# Patient Record
Sex: Male | Born: 1955 | Race: White | Hispanic: No | Marital: Married | Smoking: Former smoker
Health system: Southern US, Community
[De-identification: ages and names within clinical notes are randomized; demographics above are authoritative.]

## PROBLEM LIST (undated history)

## (undated) DIAGNOSIS — S335XXA Sprain of ligaments of lumbar spine, initial encounter: Secondary | ICD-10-CM

## (undated) DIAGNOSIS — N400 Enlarged prostate without lower urinary tract symptoms: Secondary | ICD-10-CM

## (undated) DIAGNOSIS — R351 Nocturia: Secondary | ICD-10-CM

## (undated) DIAGNOSIS — S339XXA Sprain of unspecified parts of lumbar spine and pelvis, initial encounter: Secondary | ICD-10-CM

## (undated) DIAGNOSIS — D291 Benign neoplasm of prostate: Secondary | ICD-10-CM

## (undated) DIAGNOSIS — J40 Bronchitis, not specified as acute or chronic: Secondary | ICD-10-CM

## (undated) DIAGNOSIS — M1990 Unspecified osteoarthritis, unspecified site: Secondary | ICD-10-CM

## (undated) HISTORY — DX: Bronchitis, not specified as acute or chronic: J40

## (undated) HISTORY — DX: Benign neoplasm of prostate: D29.1

## (undated) HISTORY — DX: Unspecified osteoarthritis, unspecified site: M19.90

## (undated) HISTORY — DX: Benign prostatic hyperplasia without lower urinary tract symptoms: N40.0

## (undated) HISTORY — DX: Nocturia: R35.1

## (undated) HISTORY — DX: Sprain of unspecified parts of lumbar spine and pelvis, initial encounter: S33.9XXA

## (undated) HISTORY — DX: Sprain of ligaments of lumbar spine, initial encounter: S33.5XXA

---

## 1988-01-28 HISTORY — PX: SHOULDER SURGERY: SHX246

## 2013-03-27 HISTORY — PX: COLONOSCOPY: SHX174

## 2013-06-23 ENCOUNTER — Ambulatory Visit: Payer: Self-pay | Admitting: Family Medicine

## 2014-08-09 ENCOUNTER — Encounter: Payer: Self-pay | Admitting: *Deleted

## 2014-08-10 ENCOUNTER — Ambulatory Visit: Payer: Self-pay | Admitting: Urology

## 2014-08-15 ENCOUNTER — Ambulatory Visit (INDEPENDENT_AMBULATORY_CARE_PROVIDER_SITE_OTHER): Payer: Commercial Managed Care - PPO | Admitting: Urology

## 2014-08-15 ENCOUNTER — Encounter: Payer: Self-pay | Admitting: Urology

## 2014-08-15 VITALS — BP 152/65 | HR 69 | Resp 18 | Ht 70.0 in | Wt 193.8 lb

## 2014-08-15 DIAGNOSIS — N401 Enlarged prostate with lower urinary tract symptoms: Secondary | ICD-10-CM

## 2014-08-15 DIAGNOSIS — N138 Other obstructive and reflux uropathy: Secondary | ICD-10-CM | POA: Insufficient documentation

## 2014-08-15 LAB — BLADDER SCAN AMB NON-IMAGING

## 2014-08-15 MED ORDER — DOXAZOSIN MESYLATE 1 MG PO TABS: 1.0000 mg | ORAL_TABLET | Freq: Every day | ORAL | Status: DC

## 2014-08-15 MED ORDER — FINASTERIDE 5 MG PO TABS: 5.0000 mg | ORAL_TABLET | Freq: Every day | ORAL | Status: DC

## 2014-08-15 NOTE — Progress Notes (Signed)
08/15/2014 10:32 AM   Blanch Media Dec 08, 1955 [id number]  Referring provider: No referring provider defined for this encounter.  Chief Complaint  Patient presents with  . Benign Prostatic Hypertrophy     month follow up  . Nocturia    HPI: Mr. Helzer is a 59 year old white male with BPH and LUTS who presents today for 6 month follow-up. His BPH is currently being managed with finasteride and Cardura 1 mg.  We had discussed in the past that his Cardura dose was sub therapeutic, but he did not want to increase the dosing.  Today his IPSS score is 8, which is moderate lower urinary tract symptomatology. He is mostly satisfied with his quality of life due to his urinary symptoms. He denies any dysuria, gross hematuria or suprapubic pain. His PVR today is 42 mL. He also denies any recent fevers, chills, nausea or vomiting.      IPSS      08/15/14 0900       International Prostate Symptom Score   How often have you had the sensation of not emptying your bladder? Less than 1 in 5     How often have you had to urinate less than every two hours? Almost always     How often have you found you stopped and started again several times when you urinated? Not at All     How often have you found it difficult to postpone urination? Not at All     How often have you had a weak urinary stream? Less than 1 in 5 times     How often have you had to strain to start urination? Not at All     How many times did you typically get up at night to urinate? 1 Time     Total IPSS Score 8     Quality of Life due to urinary symptoms   If you were to spend the rest of your life with your urinary condition just the way it is now how would you feel about that? Mostly Satisfied        Score:  1-7 Mild 8-19 Moderate 20-35 Severe   PMH: Past Medical History  Diagnosis Date  . Arthritis   . Bronchitis   . Benign neoplasm of prostate   . Osteoarthritis     lumbar spine  . Lumbosacral ligament sprain     . Nocturia   . BPH (benign prostatic hyperplasia)     Surgical History: Past Surgical History  Procedure Laterality Date  . Shoulder surgery Right 1990    Home Medications:    Medication List       This list is accurate as of: 08/15/14 10:32 AM.  Always use your most recent med list.               doxazosin 1 MG tablet  Commonly known as:  CARDURA  Take 1 mg by mouth daily.     finasteride 5 MG tablet  Commonly known as:  PROSCAR  Take 5 mg by mouth daily.     ibuprofen 800 MG tablet  Commonly known as:  ADVIL,MOTRIN  Take 800 mg by mouth every 8 (eight) hours as needed.        Allergies: No Known Allergies  Family History: Family History  Problem Relation Age of Onset  . Kidney disease Neg Hx   . Prostate cancer Neg Hx     Social History:  reports that he has quit smoking. He  does not have any smokeless tobacco history on file. He reports that he drinks alcohol. His drug history is not on file.  ROS: UROLOGY Frequent Urination?: No Hard to postpone urination?: No Burning/pain with urination?: No Get up at night to urinate?: No Leakage of urine?: No Urine stream starts and stops?: No Trouble starting stream?: No Do you have to strain to urinate?: No Blood in urine?: No Urinary tract infection?: No Sexually transmitted disease?: No Injury to kidneys or bladder?: No Painful intercourse?: No Weak stream?: No Erection problems?: No Penile pain?: No  Gastrointestinal Nausea?: No Vomiting?: No Indigestion/heartburn?: No Diarrhea?: No Constipation?: No  Constitutional Fever: No Night sweats?: No Weight loss?: No Fatigue?: No  Skin Skin rash/lesions?: No Itching?: No  Eyes Blurred vision?: No Double vision?: No  Ears/Nose/Throat Sore throat?: No Sinus problems?: No  Hematologic/Lymphatic Swollen glands?: No Easy bruising?: No  Cardiovascular Leg swelling?: No Chest pain?: No  Respiratory Cough?: No Shortness of breath?:  No  Endocrine Excessive thirst?: No  Musculoskeletal Back pain?: No Joint pain?: No  Neurological Headaches?: No Dizziness?: No  Psychologic Depression?: No Anxiety?: No  Physical Exam: BP 152/65 mmHg  Pulse 69  Resp 18  Ht 5\' 10"  (1.778 m)  Wt 193 lb 12.8 oz (87.907 kg)  BMI 27.81 kg/m2  GU: Patient with circumcised phallus.  Urethral meatus is patent.  No penile discharge. No penile lesions or rashes. Scrotum without lesions, cysts, rashes and/or edema.  Testicles are located scrotally bilaterally. No masses are appreciated in the testicles. Left and right epididymis are normal. Rectal: Patient with  normal sphincter tone. Perineum without scarring or rashes. No rectal masses are appreciated. Prostate is approximately 50 grams, no nodules are appreciated. Seminal vesicles are normal.   Laboratory Data: Results for orders placed or performed in visit on 08/15/14  BLADDER SCAN AMB NON-IMAGING  Result Value Ref Range   Scan Result 11ml    No results found for: WBC, HGB, HCT, MCV, PLT  No results found for: CREATININE  No results found for: PSA  No results found for: TESTOSTERONE  No results found for: HGBA1C  Urinalysis No results found for: COLORURINE, APPEARANCEUR, LABSPEC, PHURINE, GLUCOSEU, HGBUR, BILIRUBINUR, KETONESUR, PROTEINUR, UROBILINOGEN, NITRITE, LEUKOCYTESUR  Pertinent Imaging:   Assessment & Plan:    1. BPH (benign prostatic hyperplasia) with LUTS:   Patient's BPH with LUTS is well controlled with finasteride and Cardura. I have sent refills for both these medications to his pharmacy. He will follow-up in 6 months time for repeat IPSS score, PVR, DRE and PSA.  PSA history:    0.3 ng/mL on 02/09/2014  - PSA - BLADDER SCAN AMB NON-IMAGING   No Follow-up on file.  Zara Council, [facility] [address] [place], [place] [address] [phone]

## 2014-08-16 LAB — PSA: PROSTATE SPECIFIC AG, SERUM: 0.4 ng/mL (ref 0.0–4.0)

## 2014-08-17 ENCOUNTER — Telehealth: Payer: Self-pay

## 2014-08-17 NOTE — Telephone Encounter (Signed)
-----   Message from Nori Riis, PA-C sent at 08/16/2014  6:12 PM EDT ----- Patient's PSA is stable. We will see him in one year.

## 2014-08-17 NOTE — Telephone Encounter (Signed)
Spoke with pt in reference to PSA. Pt voiced understanding.  

## 2014-09-08 ENCOUNTER — Encounter: Payer: Self-pay | Admitting: Family Medicine

## 2014-09-08 ENCOUNTER — Ambulatory Visit (INDEPENDENT_AMBULATORY_CARE_PROVIDER_SITE_OTHER): Payer: Commercial Managed Care - PPO | Admitting: Family Medicine

## 2014-09-08 VITALS — BP 130/98 | HR 70 | Ht 70.0 in | Wt 194.0 lb

## 2014-09-08 DIAGNOSIS — Z0000 Encounter for general adult medical examination without abnormal findings: Secondary | ICD-10-CM | POA: Diagnosis not present

## 2014-09-08 LAB — HEMOCCULT GUIAC POC 1CARD (OFFICE): FECAL OCCULT BLD: NEGATIVE

## 2014-09-08 LAB — POCT URINALYSIS DIPSTICK
Bilirubin, UA: NEGATIVE
Blood, UA: NEGATIVE
GLUCOSE UA: NEGATIVE
KETONES UA: NEGATIVE
Leukocytes, UA: NEGATIVE
Nitrite, UA: NEGATIVE
Protein, UA: NEGATIVE
SPEC GRAV UA: 1.02
UROBILINOGEN UA: 0.2
pH, UA: 6.5

## 2014-09-08 NOTE — Progress Notes (Signed)
Name: Jacob Allen   MRN: [id number]    DOB: Dec 29, 1955   Date:09/08/2014       Progress Note  Subjective  Chief Complaint  Chief Complaint  Patient presents with  . Annual Exam    HPI Comments: No subjective nor objective concerns noted.  Epigastric discomfort with Goodys.  Familial tremor discussed.   No problem-specific assessment & plan notes found for this encounter.   Past Medical History  Diagnosis Date  . Arthritis   . Bronchitis   . Benign neoplasm of prostate   . Osteoarthritis     lumbar spine  . Lumbosacral ligament sprain   . Nocturia   . BPH (benign prostatic hyperplasia)   . Benign prostate hyperplasia     Past Surgical History  Procedure Laterality Date  . Shoulder surgery Right 1990  . Colonoscopy  03/2013    cleared for 10 yrs- Dr Allen Norris    Family History  Problem Relation Age of Onset  . Kidney disease Neg Hx   . Prostate cancer Neg Hx     Social History   Social History  . Marital Status: Married    Spouse Name: N/A  . Number of Children: N/A  . Years of Education: N/A   Occupational History  . Not on file.   Social History Main Topics  . Smoking status: Former Research scientist (life sciences)  . Smokeless tobacco: Not on file     Comment: quit 2 years ago  . Alcohol Use: 0.0 oz/week    0 Standard drinks or equivalent per week     Comment: occasional  . Drug Use: Not on file  . Sexual Activity: Yes   Other Topics Concern  . Not on file   Social History Narrative    No Known Allergies   Review of Systems  Constitutional: Negative for fever, chills, weight loss and malaise/fatigue.  HENT: Negative for ear discharge, ear pain and sore throat.   Eyes: Negative for blurred vision.  Respiratory: Negative for cough, sputum production, shortness of breath and wheezing.   Cardiovascular: Negative for chest pain, palpitations and leg swelling.  Gastrointestinal: Positive for heartburn. Negative for nausea, abdominal pain, diarrhea, constipation, blood in  stool and melena.  Genitourinary: Negative for dysuria, urgency, frequency and hematuria.  Musculoskeletal: Negative for myalgias, back pain, joint pain and neck pain.  Skin: Negative for rash.  Neurological: Positive for tremors. Negative for dizziness, tingling, sensory change, focal weakness and headaches.  Endo/Heme/Allergies: Negative for environmental allergies and polydipsia. Does not bruise/bleed easily.  Psychiatric/Behavioral: Negative for depression and suicidal ideas. The patient is not nervous/anxious and does not have insomnia.      Objective  Filed Vitals:   09/08/14 0936  BP: 130/98  Pulse: 70  Height: 5\' 10"  (1.778 m)  Weight: 194 lb (87.998 kg)    Physical Exam  Constitutional: He is oriented to person, place, and time and well-developed, well-nourished, and in no distress. No distress.  HENT:  Head: Normocephalic.  Right Ear: External ear normal.  Left Ear: External ear normal.  Nose: Nose normal.  Mouth/Throat: Oropharynx is clear and moist.  Eyes: Conjunctivae and EOM are normal. Pupils are equal, round, and reactive to light. Right eye exhibits no discharge. Left eye exhibits no discharge. No scleral icterus.  Neck: Normal range of motion. Neck supple. No JVD present. No tracheal deviation present. No thyromegaly present.  Cardiovascular: Normal rate, regular rhythm, normal heart sounds and intact distal pulses.  Exam reveals no gallop and no  friction rub.   No murmur heard. Pulmonary/Chest: Breath sounds normal. No respiratory distress. He has no wheezes. He has no rales. He exhibits no tenderness.  Abdominal: Soft. Bowel sounds are normal. He exhibits no distension and no mass. There is no hepatosplenomegaly. There is no tenderness. There is no rebound, no guarding and no CVA tenderness.  Genitourinary: Rectum normal, prostate normal and penis normal. Guaiac negative stool. He exhibits no abnormal testicular mass and no testicular tenderness.   Musculoskeletal: Normal range of motion. He exhibits no edema or tenderness.  Lymphadenopathy:    He has no cervical adenopathy.  Neurological: He is alert and oriented to person, place, and time. He has normal sensation, normal strength, normal reflexes and intact cranial nerves. No cranial nerve deficit.  Skin: Skin is warm. No rash noted.  Psychiatric: Mood and affect normal.  Nursing note and vitals reviewed.     Assessment & Plan  Problem List Items Addressed This Visit      Other   Annual physical exam - Primary   Relevant Orders   Renal Function Panel   Lipid Profile   POCT Occult Blood Stool (Completed)   POCT Urinalysis Dipstick        Dr. Otilio Miu [facility]  09/08/2014

## 2014-09-09 LAB — RENAL FUNCTION PANEL
Albumin: 4.7 g/dL (ref 3.5–5.5)
BUN/Creatinine Ratio: 12 (ref 9–20)
BUN: 11 mg/dL (ref 6–24)
CO2: 23 mmol/L (ref 18–29)
Calcium: 9.3 mg/dL (ref 8.7–10.2)
Chloride: 95 mmol/L — ABNORMAL LOW (ref 97–108)
Creatinine, Ser: 0.89 mg/dL (ref 0.76–1.27)
GFR calc Af Amer: 110 mL/min/{1.73_m2}
GFR calc non Af Amer: 96 mL/min/{1.73_m2}
Glucose: 95 mg/dL (ref 65–99)
Phosphorus: 3.7 mg/dL (ref 2.5–4.5)
Potassium: 4.4 mmol/L (ref 3.5–5.2)
Sodium: 135 mmol/L (ref 134–144)

## 2014-09-09 LAB — LIPID PANEL
Chol/HDL Ratio: 2.5 ratio units (ref 0.0–5.0)
Cholesterol, Total: 175 mg/dL (ref 100–199)
HDL: 70 mg/dL (ref >39)
LDL CALC: 97 mg/dL (ref 0–99)
TRIGLYCERIDES: 39 mg/dL (ref 0–149)
VLDL Cholesterol Cal: 8 mg/dL (ref 5–40)

## 2014-10-12 ENCOUNTER — Other Ambulatory Visit: Payer: Self-pay | Admitting: Family Medicine

## 2014-10-13 ENCOUNTER — Other Ambulatory Visit: Payer: Self-pay

## 2015-02-14 IMAGING — CR DG LUMBAR SPINE COMPLETE 4+V
1 series · 5 of 5 positions shown · non-contrast
Comparison: None.

CLINICAL DATA: Low back pain.

EXAM:
LUMBAR SPINE - COMPLETE 4+ VIEW

[Series 1: ap · 0.17mm/px · 5 of 5 slices shown]
[im 1/5]
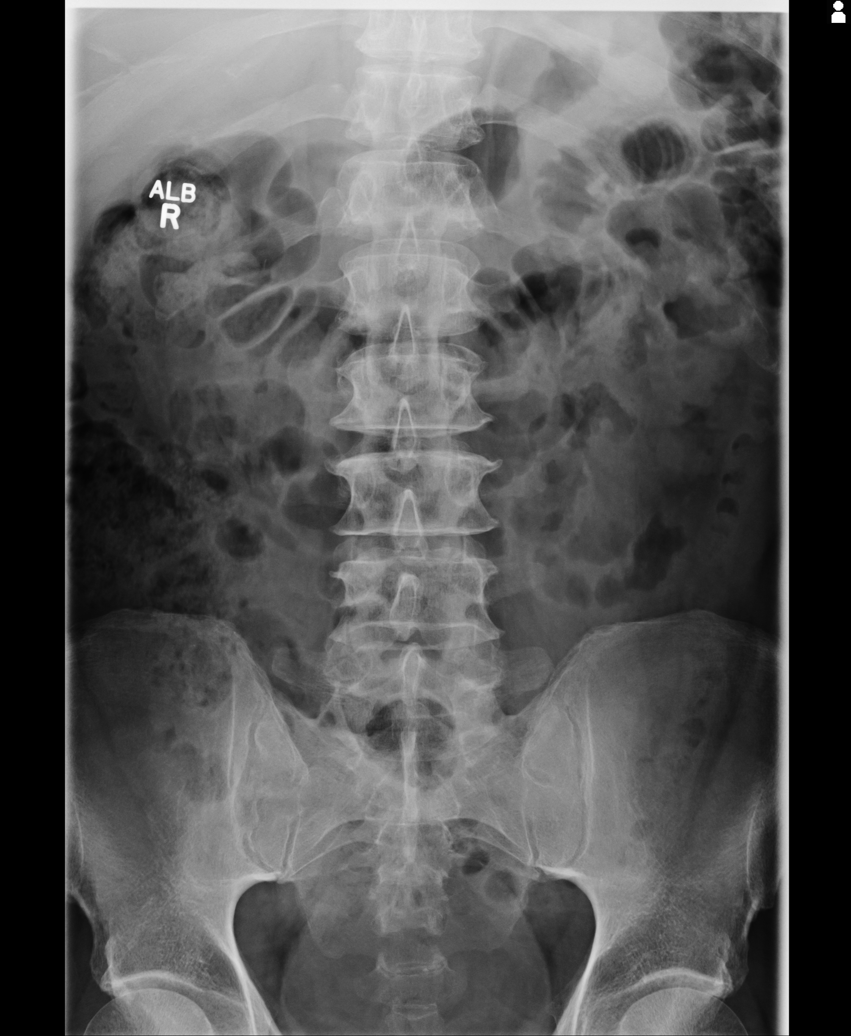
[im 2/5]
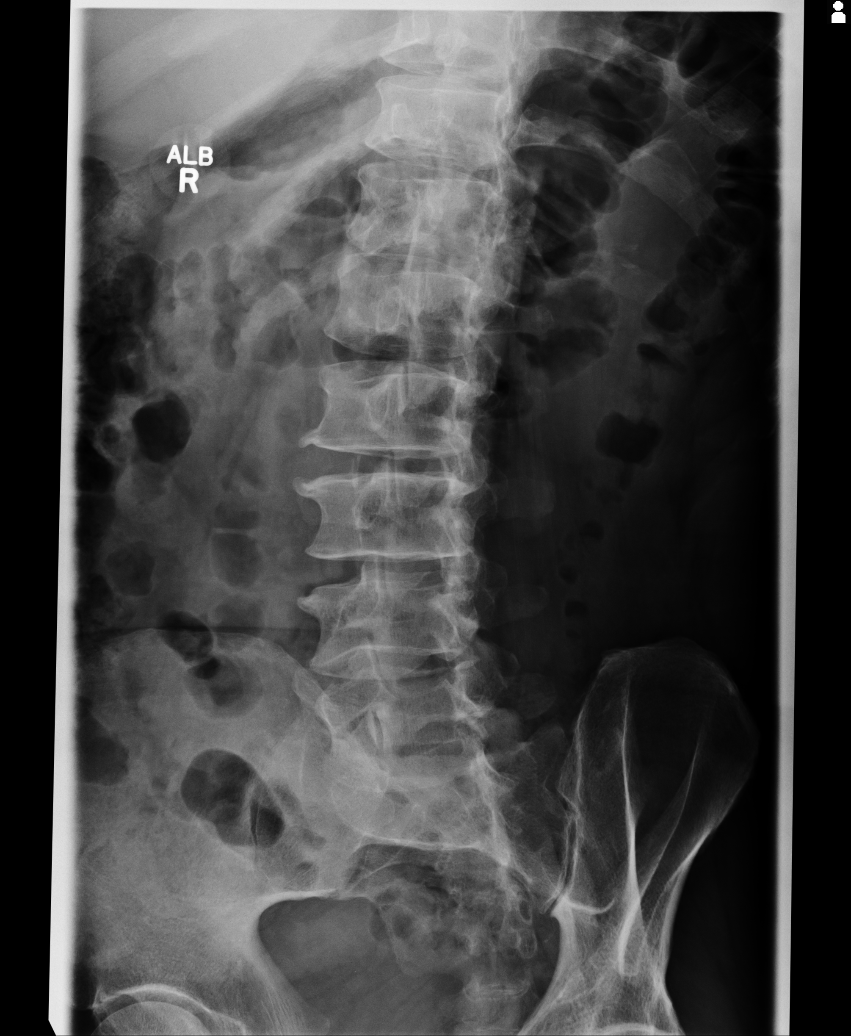
[im 3/5]
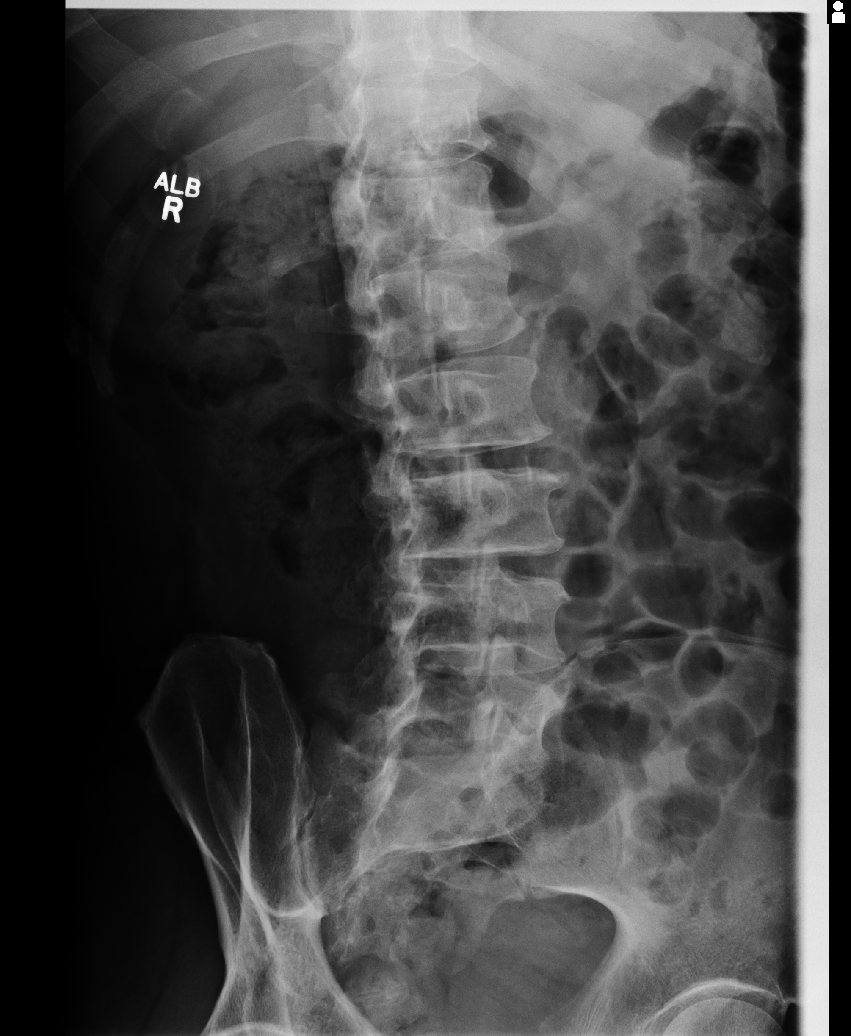
[im 4/5]
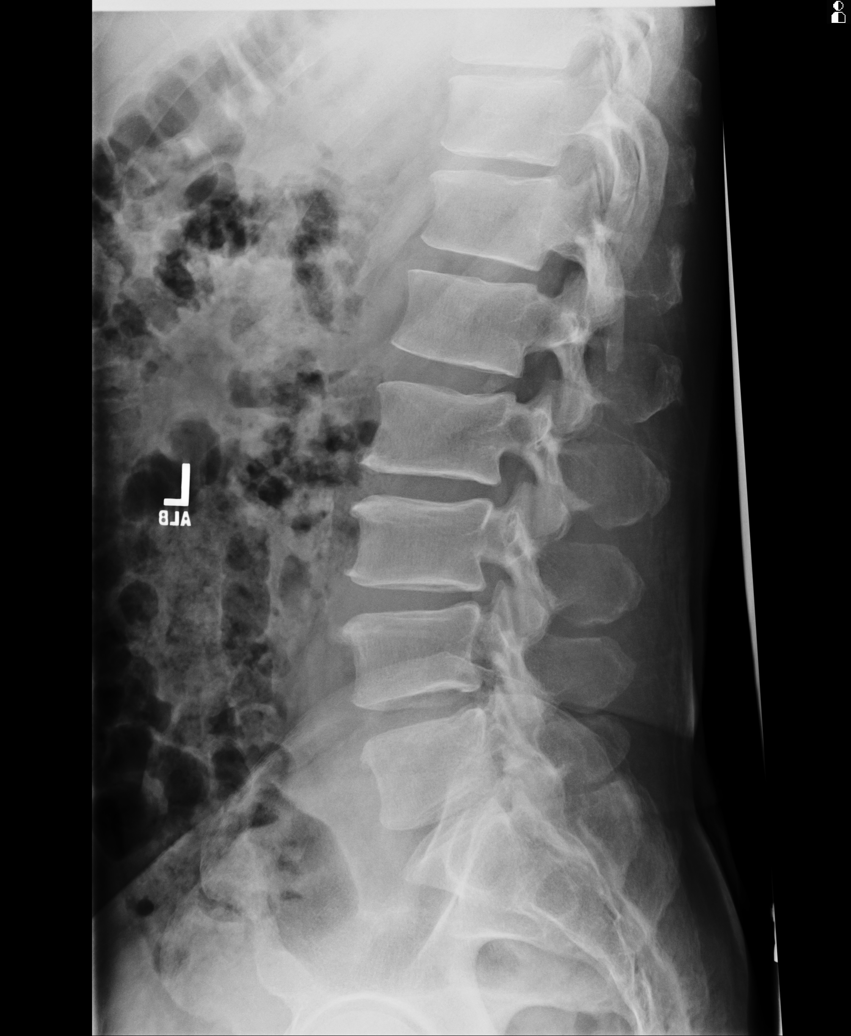
[im 5/5]
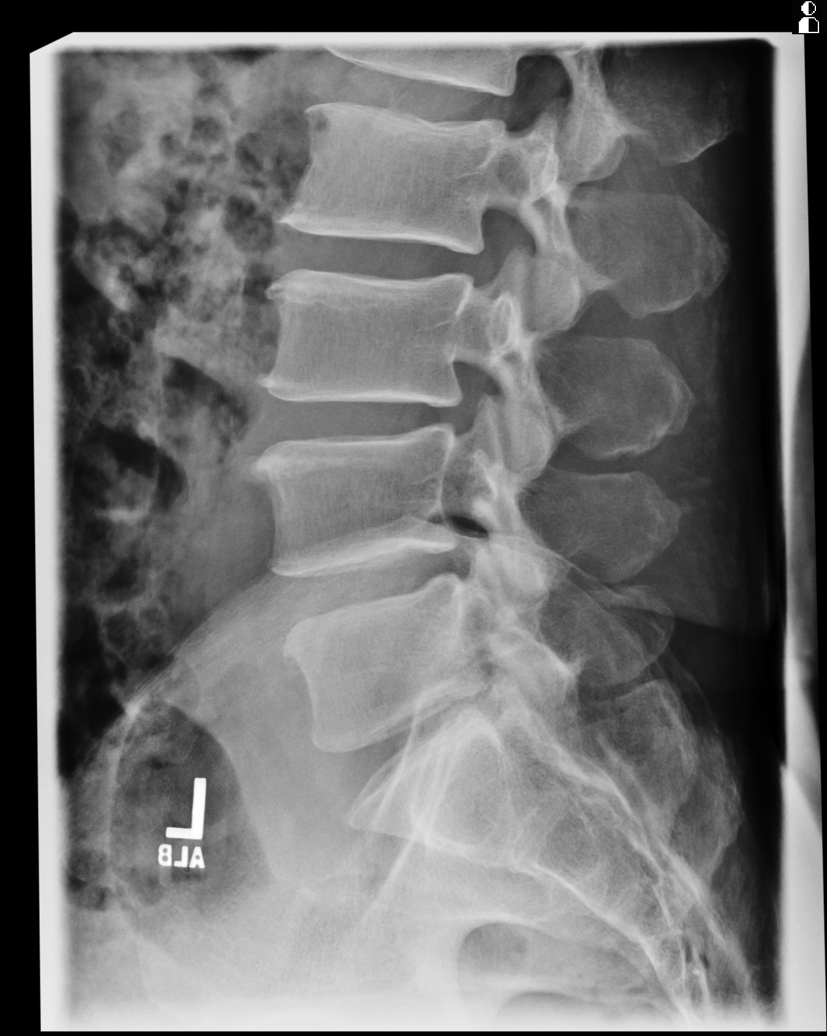

[5 of 5 positions shown; findings below may reference images not displayed]

FINDINGS: No fracture. No spondylolisthesis. Minor loss of disc height at
L2-L3 and L3-L4 with mild loss of disk height at L4-L5. Small
endplate osteophytes from L2 through L5. Remaining disk spaces and
the facet joints are well preserved.

The soft tissues are unremarkable.
IMPRESSION: No fracture or acute finding. Mild disk degenerative changes as
described.

## 2015-02-15 ENCOUNTER — Encounter: Payer: Self-pay | Admitting: Urology

## 2015-02-15 ENCOUNTER — Ambulatory Visit (INDEPENDENT_AMBULATORY_CARE_PROVIDER_SITE_OTHER): Payer: PRIVATE HEALTH INSURANCE | Admitting: Urology

## 2015-02-15 VITALS — BP 127/71 | HR 66 | Ht 70.0 in | Wt 183.3 lb

## 2015-02-15 DIAGNOSIS — N401 Enlarged prostate with lower urinary tract symptoms: Secondary | ICD-10-CM | POA: Diagnosis not present

## 2015-02-15 DIAGNOSIS — N138 Other obstructive and reflux uropathy: Secondary | ICD-10-CM

## 2015-02-15 LAB — BLADDER SCAN AMB NON-IMAGING: SCAN RESULT: 0

## 2015-02-15 NOTE — Progress Notes (Signed)
12:44 PM   Jacob Allen 1955/05/28 [id number]  Referring provider: No referring provider defined for this encounter.  Chief Complaint  Patient presents with  . Benign Prostatic Hypertrophy    6 month follow up    HPI: Patient is a 60 year old Caucasian male with BPH and LUT S who presents today for 6 month follow-up.  BPH WITH LUTS His IPSS score today is 3, which is mild lower urinary tract symptomatology. He is pleased with his quality life due to his urinary symptoms.  His PVR is 0 mL.  He denies any dysuria, hematuria or suprapubic pain.   He currently taking doxazosin 1 mg daily and finasteride 5 mg daily.  We had discussed in the past that his Cardura dose was sub therapeutic, but he did not want to increase the dosing.  He also denies any recent fevers, chills, nausea or vomiting.  He does not have a family history of PCa.      IPSS      02/15/15 0900       International Prostate Symptom Score   How often have you had the sensation of not emptying your bladder? Not at All     How often have you had to urinate less than every two hours? Not at All     How often have you found you stopped and started again several times when you urinated? Less than 1 in 5 times     How often have you found it difficult to postpone urination? Not at All     How often have you had a weak urinary stream? Less than 1 in 5 times     How often have you had to strain to start urination? Not at All     How many times did you typically get up at night to urinate? 1 Time     Total IPSS Score 3     Quality of Life due to urinary symptoms   If you were to spend the rest of your life with your urinary condition just the way it is now how would you feel about that? Pleased        Score:  1-7 Mild 8-19 Moderate 20-35 Severe   PMH: Past Medical History  Diagnosis Date  . Arthritis   . Bronchitis   . Benign neoplasm of prostate   . Osteoarthritis     lumbar spine  . Lumbosacral ligament  sprain   . Nocturia   . BPH (benign prostatic hyperplasia)   . Benign prostate hyperplasia     Surgical History: Past Surgical History  Procedure Laterality Date  . Shoulder surgery Right 1990  . Colonoscopy  03/2013    cleared for 10 yrs- Dr Allen Norris    Home Medications:    Medication List       This list is accurate as of: 02/15/15 12:44 PM.  Always use your most recent med list.               doxazosin 1 MG tablet  Commonly known as:  CARDURA  Take 1 tablet (1 mg total) by mouth daily.     finasteride 5 MG tablet  Commonly known as:  PROSCAR  Take 1 tablet (5 mg total) by mouth daily.     ibuprofen 800 MG tablet  Commonly known as:  ADVIL,MOTRIN  Take 800 mg by mouth every 8 (eight) hours as needed.        Allergies: No Known  Allergies  Family History: Family History  Problem Relation Age of Onset  . Kidney disease Neg Hx   . Prostate cancer Neg Hx     Social History:  reports that he has quit smoking. He does not have any smokeless tobacco history on file. He reports that he drinks alcohol. He reports that he does not use illicit drugs.  ROS: UROLOGY Frequent Urination?: No Hard to postpone urination?: No Burning/pain with urination?: No Get up at night to urinate?: No Leakage of urine?: No Urine stream starts and stops?: No Trouble starting stream?: No Do you have to strain to urinate?: No Blood in urine?: No Urinary tract infection?: No Sexually transmitted disease?: No Injury to kidneys or bladder?: No Painful intercourse?: No Weak stream?: No Erection problems?: No Penile pain?: No  Gastrointestinal Nausea?: No Vomiting?: No Indigestion/heartburn?: No Diarrhea?: No Constipation?: No  Constitutional Fever: No Night sweats?: No Weight loss?: No Fatigue?: No  Skin Skin rash/lesions?: No Itching?: No  Eyes Blurred vision?: No Double vision?: No  Ears/Nose/Throat Sore throat?: No Sinus problems?:  No  Hematologic/Lymphatic Swollen glands?: No Easy bruising?: No  Cardiovascular Leg swelling?: No Chest pain?: No  Respiratory Cough?: No Shortness of breath?: No  Endocrine Excessive thirst?: No  Musculoskeletal Back pain?: No Joint pain?: No  Neurological Headaches?: No Dizziness?: No  Psychologic Depression?: No Anxiety?: No  Physical Exam: BP 127/71 mmHg  Pulse 66  Ht 5\' 10"  (1.778 m)  Wt 183 lb 4.8 oz (83.144 kg)  BMI 26.30 kg/m2  GU: Patient with circumcised phallus.  Urethral meatus is patent.  No penile discharge. No penile lesions or rashes. Scrotum without lesions, cysts, rashes and/or edema.  Testicles are located scrotally bilaterally. No masses are appreciated in the testicles. Left and right epididymis are normal. Rectal: Patient with  normal sphincter tone. Perineum without scarring or rashes. No rectal masses are appreciated. Prostate is approximately 50 grams, no nodules are appreciated. Seminal vesicles are normal.   Laboratory Data:  Lab Results  Component Value Date   CREATININE 0.89 09/08/2014    PSA History   0.3 ng/mL on 02/09/2014   Pertinent Imaging: Results for Jacob, Allen (MRN [id number]) as of 02/15/2015 12:43  Ref. Range 02/15/2015 09:00  Scan Result Unknown 0    Assessment & Plan:    1. BPH (benign prostatic hyperplasia) with LUTS:   Patient's IPSS score is 3/1.  Patient's BPH with LUTS is well controlled with finasteride and Cardura.   He does not need a refill at this time.   He will follow-up in 12 months time for repeat IPSS score, exam and PSA.  - PSA - BLADDER SCAN AMB NON-IMAGING   Return in about 1 year (around 02/15/2016) for IPSS score and exam.  Zara Council, [facility]  [facility] [address], [place], [place] [address] [phone]

## 2015-02-16 ENCOUNTER — Telehealth: Payer: Self-pay

## 2015-02-16 LAB — PSA: Prostate Specific Ag, Serum: 0.3 ng/mL (ref 0.0–4.0)

## 2015-02-16 NOTE — Telephone Encounter (Signed)
Spoke with pt in reference to PSA results. Pt voiced understanding.  

## 2015-02-16 NOTE — Telephone Encounter (Signed)
-----   Message from Nori Riis, PA-C sent at 02/16/2015  7:50 AM EST ----- PSA is stable.  We will see him next year.

## 2015-07-13 ENCOUNTER — Encounter: Payer: Self-pay | Admitting: Family Medicine

## 2015-07-13 ENCOUNTER — Ambulatory Visit (INDEPENDENT_AMBULATORY_CARE_PROVIDER_SITE_OTHER): Payer: PRIVATE HEALTH INSURANCE | Admitting: Family Medicine

## 2015-07-13 VITALS — BP 134/70 | HR 64 | Ht 70.0 in | Wt 183.0 lb

## 2015-07-13 DIAGNOSIS — M542 Cervicalgia: Secondary | ICD-10-CM | POA: Insufficient documentation

## 2015-07-13 DIAGNOSIS — M5412 Radiculopathy, cervical region: Secondary | ICD-10-CM

## 2015-07-13 MED ORDER — PREDNISONE 10 MG PO TABS: 10.0000 mg | ORAL_TABLET | Freq: Every day | ORAL | Status: DC

## 2015-07-13 MED ORDER — MELOXICAM 15 MG PO TABS
15.0000 mg | ORAL_TABLET | Freq: Every day | ORAL | Status: DC
Start: 2015-07-13 — End: 2016-04-18

## 2015-07-13 NOTE — Progress Notes (Signed)
Name: Jacob Allen   MRN: [id number]    DOB: 1955-09-24   Date:07/13/2015       Progress Note  Subjective  Chief Complaint  Chief Complaint  Patient presents with  . Neck Pain    runs down shoulder and back of arm- has been to chiropractor x 10 visits- not getting better    Neck Pain  This is a chronic problem. The current episode started more than 1 year ago. The problem occurs daily. The problem has been gradually worsening. The pain is associated with nothing. The pain is present in the right side. The quality of the pain is described as aching. The pain is at a severity of 1/10. The pain is mild. Exacerbated by: positioning at computer. The pain is same all the time. Associated symptoms include numbness, paresis, tingling and weakness. Pertinent negatives include no chest pain, fever, headaches or weight loss. He has tried NSAIDs and chiropractic manipulation for the symptoms. The treatment provided mild relief.    No problem-specific assessment & plan notes found for this encounter.   Past Medical History  Diagnosis Date  . Arthritis   . Bronchitis   . Benign neoplasm of prostate   . Osteoarthritis     lumbar spine  . Lumbosacral ligament sprain   . Nocturia   . BPH (benign prostatic hyperplasia)   . Benign prostate hyperplasia     Past Surgical History  Procedure Laterality Date  . Shoulder surgery Right 1990  . Colonoscopy  03/2013    cleared for 10 yrs- Dr Allen Norris    Family History  Problem Relation Age of Onset  . Kidney disease Neg Hx   . Prostate cancer Neg Hx     Social History   Social History  . Marital Status: Married    Spouse Name: N/A  . Number of Children: N/A  . Years of Education: N/A   Occupational History  . Not on file.   Social History Main Topics  . Smoking status: Former Research scientist (life sciences)  . Smokeless tobacco: Not on file     Comment: quit 2 years ago  . Alcohol Use: 0.0 oz/week    0 Standard drinks or equivalent per week     Comment: every  day  . Drug Use: No  . Sexual Activity: Yes   Other Topics Concern  . Not on file   Social History Narrative    No Known Allergies   Review of Systems  Constitutional: Negative for fever, chills, weight loss and malaise/fatigue.  HENT: Negative for ear discharge, ear pain and sore throat.   Eyes: Negative for blurred vision.  Respiratory: Negative for cough, sputum production, shortness of breath and wheezing.   Cardiovascular: Negative for chest pain, palpitations and leg swelling.  Gastrointestinal: Negative for heartburn, nausea, abdominal pain, diarrhea, constipation, blood in stool and melena.  Genitourinary: Negative for dysuria, urgency, frequency and hematuria.  Musculoskeletal: Positive for neck pain. Negative for myalgias, back pain and joint pain.  Skin: Negative for rash.  Neurological: Positive for tingling, weakness and numbness. Negative for dizziness, sensory change, focal weakness and headaches.  Endo/Heme/Allergies: Negative for environmental allergies and polydipsia. Does not bruise/bleed easily.  Psychiatric/Behavioral: Negative for depression and suicidal ideas. The patient is not nervous/anxious and does not have insomnia.      Objective  Filed Vitals:   07/13/15 1353  BP: 134/70  Pulse: 64  Height: 5\' 10"  (1.778 m)  Weight: 183 lb (83.008 kg)    Physical Exam  Constitutional: He is oriented to person, place, and time and well-developed, well-nourished, and in no distress.  HENT:  Head: Normocephalic.  Right Ear: External ear normal.  Left Ear: External ear normal.  Nose: Nose normal.  Mouth/Throat: Oropharynx is clear and moist.  Eyes: Conjunctivae and EOM are normal. Pupils are equal, round, and reactive to light. Right eye exhibits no discharge. Left eye exhibits no discharge. No scleral icterus.  Neck: Normal range of motion. Neck supple. No JVD present. No tracheal deviation present. No thyromegaly present.  Cardiovascular: Normal rate,  regular rhythm, normal heart sounds, intact distal pulses and normal pulses.  Exam reveals no gallop and no friction rub.   No murmur heard. Pulmonary/Chest: Breath sounds normal. No respiratory distress. He has no wheezes. He has no rales.  Abdominal: Soft. Bowel sounds are normal. He exhibits no mass. There is no hepatosplenomegaly. There is no tenderness. There is no rebound, no guarding and no CVA tenderness.  Musculoskeletal: He exhibits no edema or tenderness.       Cervical back: He exhibits decreased range of motion and spasm. He exhibits no tenderness, no bony tenderness and normal pulse.  Lymphadenopathy:    He has no cervical adenopathy.  Neurological: He is alert and oriented to person, place, and time. He has normal sensation, normal strength and normal reflexes. No cranial nerve deficit.  Skin: Skin is warm. No rash noted.  Psychiatric: Mood and affect normal.  Nursing note and vitals reviewed.     Assessment & Plan  Problem List Items Addressed This Visit      Nervous and Auditory   Cervical radiculitis   Relevant Medications   meloxicam (MOBIC) 15 MG tablet   predniSONE (DELTASONE) 10 MG tablet     Other   Cervical pain (neck) - Primary   Relevant Medications   meloxicam (MOBIC) 15 MG tablet   predniSONE (DELTASONE) 10 MG tablet        Dr. Chayse Zatarain [facility]  07/13/2015

## 2015-07-13 NOTE — Patient Instructions (Signed)

## 2015-07-16 ENCOUNTER — Other Ambulatory Visit: Payer: Self-pay

## 2015-08-07 ENCOUNTER — Other Ambulatory Visit: Payer: Self-pay

## 2015-08-07 DIAGNOSIS — M542 Cervicalgia: Secondary | ICD-10-CM

## 2015-08-29 ENCOUNTER — Ambulatory Visit: Payer: No Typology Code available for payment source | Attending: Orthopedic Surgery | Admitting: Physical Therapy

## 2015-08-29 DIAGNOSIS — M2560 Stiffness of unspecified joint, not elsewhere classified: Secondary | ICD-10-CM

## 2015-08-29 DIAGNOSIS — M6281 Muscle weakness (generalized): Secondary | ICD-10-CM

## 2015-08-29 DIAGNOSIS — M542 Cervicalgia: Secondary | ICD-10-CM | POA: Diagnosis not present

## 2015-08-30 ENCOUNTER — Encounter: Payer: Self-pay | Admitting: Physical Therapy

## 2015-08-30 NOTE — Therapy (Addendum)
[facility] [address] [place], [place], [address] Phone: [phone]   Fax:  [phone]  Physical Therapy Evaluation  Patient Details  Name: Jacob Allen MRN: [id number] Date of Birth: March 29, 1955 Referring Provider: Reche Dixon, PA-C  Encounter Date: 08/29/2015      PT End of Session - 08/30/15 2008    Visit Number 1   Number of Visits 1   Date for PT Re-Evaluation 08/30/15   PT Start Time [id number]   PT Stop Time 1501   PT Time Calculation (min) 67 min   Activity Tolerance Patient tolerated treatment well;Patient limited by pain   Behavior During Therapy [facility] for tasks assessed/performed      Past Medical History:  Diagnosis Date  . Arthritis   . Benign neoplasm of prostate   . Benign prostate hyperplasia   . BPH (benign prostatic hyperplasia)   . Bronchitis   . Lumbosacral ligament sprain   . Nocturia   . Osteoarthritis    lumbar spine    Past Surgical History:  Procedure Laterality Date  . COLONOSCOPY  03/2013   cleared for 10 yrs- Dr Allen Norris  . SHOULDER SURGERY Right 1990    There were no vitals filed for this visit.       Subjective Assessment - 08/30/15 2006    Limitations House hold activities;Other (comment)   Patient Stated Goals Decrease neck pain/ return to gym program with no pain.    Currently in Pain? Yes            [facility] PT Assessment - 08/30/15 0001      Assessment   Medical Diagnosis Cervical radiculopathy   Referring Provider Reche Dixon, PA-C       Pt. reports minimal neck discomfort at rest and increase R cervical/UT discomfort with repeated movement patterns/ overhead reaching.  Pt. reports R UE muscle weakness and grip strength deficits.      There.ex.: see HEP.  Discussed gym based ex. Program.      Pt. is a 60 y/o male with >2 years c/o neck pain/ R UE numbness/ muscle weakness.  Pt. has tried 8+ treatment sessions at [facility] with minimal to no benefits.  Pt. enjoys lifting wts/  working out but has been limited by chronic neck/ R UE radicular symptoms.  Cervical AROM WFL (all planes) and B shoulder AROM WNL.  Pt. has c/o numbness in R UE/grasp reported and will occaionally drop objects.  NDI: 16% self-perceived mild disability.  No c/o headaches at this time.  (+) R UT/cervical muscle tightness and tenderness noted with palpation as compared to L side.  B UE strength grossly 5/5 MMT except R shoulder flexion/ abd. 4+/5 MMT.  Grip strength: L 116#/ R 76#.  PT explained cervical/ UE anatomy on X-ray and cause of right sided C6-7 radicular symptoms.  Pt. instructed in proper technique/ posture with UE gym based and HEP.  Pt. instructed to contact PT over next several weeks if any questions of issues with return to ex. program.            Plan - 08/30/15 2009    Rehab Potential Good   PT Frequency 1x / week   PT Treatment/Interventions Dry needling;Manual techniques;Therapeutic activities;Therapeutic exercise;Electrical Stimulation;Cryotherapy;Moist Heat   PT Next Visit Plan 1x treatment with HEP focus       Patient will benefit from skilled therapeutic intervention in order to improve the following deficits and impairments:  Hypomobility, Pain, Improper body mechanics,  Impaired flexibility, Decreased strength  Visit Diagnosis: Neck pain  Muscle weakness (generalized)  Joint stiffness     Problem List Patient Active Problem List   Diagnosis Date Noted  . Cervical radiculitis 07/13/2015  . Cervical pain (neck) 07/13/2015  . Annual physical exam 09/08/2014  . BPH with obstruction/lower urinary tract symptoms 08/15/2014   Pura Spice, PT, DPT # [phone]  08/30/2015, 8:11 PM  [place] [facility] [address] [place], [place], [address] Phone: [phone]   Fax:  [phone]  Name: Jacob Allen MRN: [id number] Date of Birth: 1956-01-10

## 2015-09-10 ENCOUNTER — Ambulatory Visit (INDEPENDENT_AMBULATORY_CARE_PROVIDER_SITE_OTHER): Payer: PRIVATE HEALTH INSURANCE | Admitting: Family Medicine

## 2015-09-10 ENCOUNTER — Encounter: Payer: Self-pay | Admitting: Family Medicine

## 2015-09-10 VITALS — BP 130/90 | HR 72 | Ht 70.0 in | Wt 183.0 lb

## 2015-09-10 DIAGNOSIS — N138 Other obstructive and reflux uropathy: Secondary | ICD-10-CM

## 2015-09-10 DIAGNOSIS — Z0000 Encounter for general adult medical examination without abnormal findings: Secondary | ICD-10-CM

## 2015-09-10 DIAGNOSIS — N5201 Erectile dysfunction due to arterial insufficiency: Secondary | ICD-10-CM

## 2015-09-10 DIAGNOSIS — N401 Enlarged prostate with lower urinary tract symptoms: Secondary | ICD-10-CM | POA: Diagnosis not present

## 2015-09-10 LAB — POCT URINALYSIS DIPSTICK
BILIRUBIN UA: NEGATIVE
GLUCOSE UA: NEGATIVE
KETONES UA: NEGATIVE
Leukocytes, UA: NEGATIVE
Nitrite, UA: NEGATIVE
Protein, UA: NEGATIVE
RBC UA: NEGATIVE
SPEC GRAV UA: 1.01
UROBILINOGEN UA: 0.2
pH, UA: 7

## 2015-09-10 NOTE — Progress Notes (Signed)
Name: Jacob Allen   MRN: [id number]    DOB: [birth year]   Date:09/10/2015       Progress Note  Subjective  Chief Complaint  Chief Complaint  Patient presents with  . Annual Exam    Jacob Allen does his PSA levels    Patient presents for annual physical exam.    No problem-specific Assessment & Plan notes found for this encounter.   Past Medical History:  Diagnosis Date  . Arthritis   . Benign neoplasm of prostate   . Benign prostate hyperplasia   . BPH (benign prostatic hyperplasia)   . Bronchitis   . Lumbosacral ligament sprain   . Nocturia   . Osteoarthritis    lumbar spine    Past Surgical History:  Procedure Laterality Date  . COLONOSCOPY  03/2013   cleared for 10 yrs- Dr Allen Norris  . SHOULDER SURGERY Right 1990    Family History  Problem Relation Age of Onset  . Kidney disease Neg Hx   . Prostate cancer Neg Hx     Social History   Social History  . Marital status: Married    Spouse name: N/A  . Number of children: N/A  . Years of education: N/A   Occupational History  . Not on file.   Social History Main Topics  . Smoking status: Former Research scientist (life sciences)  . Smokeless tobacco: Not on file     Comment: quit 2 years ago  . Alcohol use 0.0 oz/week     Comment: every day  . Drug use: No  . Sexual activity: Yes   Other Topics Concern  . Not on file   Social History Narrative  . No narrative on file    No Known Allergies   Review of Systems  Constitutional: Negative for chills, fever, malaise/fatigue and weight loss.  HENT: Negative for ear discharge, ear pain and sore throat.   Eyes: Negative for blurred vision.  Respiratory: Negative for cough, sputum production, shortness of breath and wheezing.   Cardiovascular: Negative for chest pain, palpitations and leg swelling.  Gastrointestinal: Negative for abdominal pain, blood in stool, constipation, diarrhea, heartburn, melena and nausea.  Genitourinary: Negative for dysuria, frequency, hematuria and urgency.   Musculoskeletal: Positive for joint pain. Negative for back pain, myalgias and neck pain.  Skin: Negative for rash.  Neurological: Negative for dizziness, tingling, sensory change, focal weakness and headaches.  Endo/Heme/Allergies: Negative for environmental allergies and polydipsia. Does not bruise/bleed easily.  Psychiatric/Behavioral: Negative for depression and suicidal ideas. The patient is not nervous/anxious and does not have insomnia.      Objective  Vitals:   09/10/15 0831  BP: 130/90  Pulse: 72  Weight: 183 lb (83 kg)  Height: 5\' 10"  (1.778 m)    Physical Exam  Constitutional: He is oriented to person, place, and time and well-developed, well-nourished, and in no distress.  HENT:  Head: Normocephalic.  Right Ear: Tympanic membrane, external ear and ear canal normal.  Left Ear: Tympanic membrane, external ear and ear canal normal.  Nose: Nose normal.  Mouth/Throat: Oropharynx is clear and moist.  Eyes: Conjunctivae and EOM are normal. Pupils are equal, round, and reactive to light. Right eye exhibits no discharge. Left eye exhibits no discharge. No scleral icterus.  Neck: Normal range of motion. Neck supple. No JVD present. No tracheal deviation present. No thyromegaly present.  Cardiovascular: Normal rate, regular rhythm, normal heart sounds and intact distal pulses.  Exam reveals no gallop and no friction rub.   No  murmur heard. Pulmonary/Chest: Breath sounds normal. No respiratory distress. He has no wheezes. He has no rales.  Abdominal: Soft. Bowel sounds are normal. He exhibits no mass. There is no hepatosplenomegaly. There is no tenderness. There is no rebound, no guarding and no CVA tenderness.  Genitourinary: Rectum normal. Rectal exam shows guaiac negative stool. No discharge found.  Musculoskeletal: Normal range of motion. He exhibits no edema or tenderness.  Lymphadenopathy:    He has no cervical adenopathy.  Neurological: He is alert and oriented to person,  place, and time. He has normal sensation, normal strength, normal reflexes and intact cranial nerves. No cranial nerve deficit.  Skin: Skin is warm. No rash noted.  Psychiatric: Mood and affect normal.  Nursing note and vitals reviewed.     Assessment & Plan  Problem List Items Addressed This Visit      Genitourinary   BPH with obstruction/lower urinary tract symptoms   Relevant Orders   POCT urinalysis dipstick (Completed)     Other   Annual physical exam - Primary   Relevant Orders   COMPLETE METABOLIC PANEL WITH GFR   Lipid Profile    Other Visit Diagnoses    Erectile dysfunction due to arterial insufficiency       sample viagra   Relevant Orders   Testosterone, Free, Total, SHBG        Dr. Darcy Cordner [facility]  09/10/15

## 2015-09-11 LAB — LIPID PANEL
CHOLESTEROL TOTAL: 207 mg/dL — AB (ref 100–199)
Chol/HDL Ratio: 2.7 ratio units (ref 0.0–5.0)
HDL: 78 mg/dL (ref >39)
LDL Calculated: 118 mg/dL — ABNORMAL HIGH (ref 0–99)
Triglycerides: 53 mg/dL (ref 0–149)
VLDL Cholesterol Cal: 11 mg/dL (ref 5–40)

## 2015-09-11 LAB — TESTOSTERONE, FREE, TOTAL, SHBG
Sex Hormone Binding: 55.3 nmol/L (ref 19.3–76.4)
Testosterone, Free: 9.2 pg/mL (ref 7.2–24.0)
Testosterone: 398 ng/dL (ref 264–916)

## 2015-09-18 ENCOUNTER — Encounter: Payer: PRIVATE HEALTH INSURANCE | Admitting: Physical Therapy

## 2015-09-18 NOTE — Addendum Note (Signed)
Addended by: Dorcas Carrow C on: 09/18/2015 01:30 PM   Modules accepted: Orders

## 2015-10-10 ENCOUNTER — Other Ambulatory Visit: Payer: Self-pay | Admitting: Urology

## 2015-10-10 DIAGNOSIS — N400 Enlarged prostate without lower urinary tract symptoms: Secondary | ICD-10-CM

## 2015-10-24 ENCOUNTER — Telehealth: Payer: Self-pay | Admitting: Urology

## 2015-10-24 DIAGNOSIS — N400 Enlarged prostate without lower urinary tract symptoms: Secondary | ICD-10-CM

## 2015-10-24 MED ORDER — DOXAZOSIN MESYLATE 1 MG PO TABS: 1.0000 mg | ORAL_TABLET | Freq: Every day | ORAL | 2 refills | Status: DC

## 2015-10-24 MED ORDER — FINASTERIDE 5 MG PO TABS: 5.0000 mg | ORAL_TABLET | Freq: Every day | ORAL | 2 refills | Status: DC

## 2015-10-24 NOTE — Telephone Encounter (Signed)
Patient is requesting a prescription for a 90-day supply of his medications:  Doxazosin and Finasteride sent into the [facility] in [place].   He is going out of town for 2 months on Friday and will not have enough medication.  Please have someone call him and let him know.

## 2015-10-24 NOTE — Telephone Encounter (Signed)
Medications were sent to pharmacy.

## 2015-10-24 NOTE — Telephone Encounter (Signed)
That is fine 

## 2016-02-11 ENCOUNTER — Other Ambulatory Visit: Payer: PRIVATE HEALTH INSURANCE

## 2016-02-18 ENCOUNTER — Ambulatory Visit: Payer: PRIVATE HEALTH INSURANCE | Admitting: Urology

## 2016-03-25 ENCOUNTER — Other Ambulatory Visit: Payer: Self-pay

## 2016-03-25 DIAGNOSIS — N401 Enlarged prostate with lower urinary tract symptoms: Secondary | ICD-10-CM

## 2016-03-26 ENCOUNTER — Other Ambulatory Visit: Payer: PRIVATE HEALTH INSURANCE

## 2016-03-26 DIAGNOSIS — N401 Enlarged prostate with lower urinary tract symptoms: Secondary | ICD-10-CM

## 2016-03-27 LAB — PSA: PROSTATE SPECIFIC AG, SERUM: 0.5 ng/mL (ref 0.0–4.0)

## 2016-04-01 NOTE — Progress Notes (Signed)
8:38 AM   Jacob Allen 10-31-1955 [id number]  Referring provider: Juline Patch, MD [address] [place], [place] [address]  Chief Complaint  Patient presents with  . Benign Prostatic Hypertrophy    HPI: 61 yo WM with BPH with LU TS and ED who present for a 1 year follow up.  BPH WITH LUTS His IPSS score today is 4, which is mild lower urinary tract symptomatology.  He is mostly satisfied with his quality life due to his urinary symptoms.  His PVR is 0 mL.  His previous I PSS score was 3/2.  His previous PVR was 0 mL.  He is complaining of frequency and nocturia x 1.  He denies any dysuria, hematuria or suprapubic pain.   He currently taking doxazosin 1 mg daily and finasteride 5 mg daily.  We had discussed in the past that his Cardura dose was sub therapeutic, but he did not want to increase the dosing.  He also denies any recent fevers, chills, nausea or vomiting.  He does not have a family history of PCa.     IPSS    Row Name 04/02/16 0800         International Prostate Symptom Score   How often have you had the sensation of not emptying your bladder? Less than 1 in 5     How often have you had to urinate less than every two hours? Not at All     How often have you found you stopped and started again several times when you urinated? Not at All     How often have you found it difficult to postpone urination? Less than 1 in 5 times     How often have you had a weak urinary stream? Less than 1 in 5 times     How often have you had to strain to start urination? Not at All     How many times did you typically get up at night to urinate? 1 Time     Total IPSS Score 4       Quality of Life due to urinary symptoms   If you were to spend the rest of your life with your urinary condition just the way it is now how would you feel about that? Mostly Satisfied        Score:  1-7 Mild 8-19 Moderate 20-35 Severe  Erectile dysfunction His SHIM score is 14, which is  mild to moderate ED.   He has been having difficulty with erections for one year.   His major complaint is achieving and maintaining an erection.  His libido is preserved.   His risk factors for ED are age and BPH.   He denies any painful erections or curvatures with his erections.   He is still having spontaneous erections.   He has tried Viagra in the past and it was effective.       SHIM    Row Name 04/02/16 [id number]         SHIM: Over the last 6 months:   How do you rate your confidence that you could get and keep an erection? Low     When you had erections with sexual stimulation, how often were your erections hard enough for penetration (entering your partner)? Sometimes (about half the time)     During sexual intercourse, how often were you able to maintain your erection after you had penetrated (entered) your partner? Difficult  During sexual intercourse, how difficult was it to maintain your erection to completion of intercourse? Difficult     When you attempted sexual intercourse, how often was it satisfactory for you? Difficult       SHIM Total Score   SHIM 14        Score: 1-7 Severe ED 8-11 Moderate ED 12-16 Mild-Moderate ED 17-21 Mild ED 22-25 No ED   PMH: Past Medical History:  Diagnosis Date  . Arthritis   . Benign neoplasm of prostate   . Benign prostate hyperplasia   . BPH (benign prostatic hyperplasia)   . Bronchitis   . Lumbosacral ligament sprain   . Nocturia   . Osteoarthritis    lumbar spine    Surgical History: Past Surgical History:  Procedure Laterality Date  . COLONOSCOPY  03/2013   cleared for 10 yrs- Dr Allen Norris  . SHOULDER SURGERY Right 1990    Home Medications:  Allergies as of 04/02/2016   No Known Allergies     Medication List       Accurate as of 04/02/16  8:38 AM. Always use your most recent med list.          doxazosin 1 MG tablet Commonly known as:  CARDURA Take 1 tablet (1 mg total) by mouth daily.   finasteride 5 MG  tablet Commonly known as:  PROSCAR Take 1 tablet (5 mg total) by mouth daily.   gabapentin 100 MG capsule Commonly known as:  NEURONTIN Take 2 capsules by mouth at bedtime. Todd mundy   ibuprofen 800 MG tablet Commonly known as:  ADVIL,MOTRIN Take 800 mg by mouth every 8 (eight) hours as needed.   meloxicam 15 MG tablet Commonly known as:  MOBIC Take 1 tablet (15 mg total) by mouth daily.       Allergies: No Known Allergies  Family History: Family History  Problem Relation Age of Onset  . Kidney disease Neg Hx   . Prostate cancer Neg Hx     Social History:  reports that he has quit smoking. He does not have any smokeless tobacco history on file. He reports that he drinks alcohol. He reports that he does not use drugs.  ROS: UROLOGY Frequent Urination?: No Hard to postpone urination?: No Burning/pain with urination?: No Get up at night to urinate?: No Leakage of urine?: No Urine stream starts and stops?: No Trouble starting stream?: No Do you have to strain to urinate?: No Blood in urine?: No Urinary tract infection?: No Sexually transmitted disease?: No Injury to kidneys or bladder?: No Painful intercourse?: No Weak stream?: No Erection problems?: No Penile pain?: No  Gastrointestinal Nausea?: No Vomiting?: No Indigestion/heartburn?: No Diarrhea?: No Constipation?: No  Constitutional Fever: No Night sweats?: No Weight loss?: No Fatigue?: No  Skin Skin rash/lesions?: No Itching?: No  Eyes Blurred vision?: No Double vision?: No  Ears/Nose/Throat Sore throat?: No Sinus problems?: No  Hematologic/Lymphatic Swollen glands?: No Easy bruising?: No  Cardiovascular Leg swelling?: No Chest pain?: No  Respiratory Cough?: No Shortness of breath?: No  Endocrine Excessive thirst?: No  Musculoskeletal Back pain?: No Joint pain?: No  Neurological Headaches?: No Dizziness?: No  Psychologic Depression?: No Anxiety?: No  Physical  Exam: BP 130/76   Pulse 65   Ht 5\' 10"  (1.778 m)   Wt 191 lb 12.8 oz (87 kg)   BMI 27.52 kg/m   GU: Patient with circumcised phallus.  Urethral meatus is patent.  No penile discharge. No penile lesions or rashes. Scrotum  without lesions, cysts, rashes and/or edema.  Testicles are located scrotally bilaterally. No masses are appreciated in the testicles. Left and right epididymis are normal. Rectal: Patient with  normal sphincter tone. Perineum without scarring or rashes. No rectal masses are appreciated. Prostate is approximately 50 grams, no nodules are appreciated. Seminal vesicles are normal.   Laboratory Data:  Lab Results  Component Value Date   CREATININE 0.89 09/08/2014    PSA History   0.3 ng/mL on 02/09/2014   0.4 ng/mL on 08/15/2014   0.3 ng/mL on 02/15/2015   0.5 ng/mL on 03/26/2016   Assessment & Plan:    1. BPH with LUTS  - IPSS score is 4/2, it is stable worsening  - Continue conservative management, avoiding bladder irritants and timed voiding's  - Continue doxazosin 1 mg daily and finasteride 5 mg daily:refills given  - RTC in 12 months for IPSS, PSA and exam   2. Erectile dysfunction  - SHIM score is 14  - Continue sildenafil 20 mg, 3 to 5 tablets two hours prior to intercourse on an empty stomach, # 50  - RTC in 12 months for repeat SHIM score and exam   Return in about 1 year (around 04/02/2017) for IPSS, SHIM, PSA and exam.  Zara Council, [facility]  [place] [address], [place], [place] [address] [phone]

## 2016-04-02 ENCOUNTER — Encounter: Payer: Self-pay | Admitting: Urology

## 2016-04-02 ENCOUNTER — Ambulatory Visit (INDEPENDENT_AMBULATORY_CARE_PROVIDER_SITE_OTHER): Payer: PRIVATE HEALTH INSURANCE | Admitting: Urology

## 2016-04-02 VITALS — BP 130/76 | HR 65 | Ht 70.0 in | Wt 191.8 lb

## 2016-04-02 DIAGNOSIS — N400 Enlarged prostate without lower urinary tract symptoms: Secondary | ICD-10-CM

## 2016-04-02 DIAGNOSIS — N529 Male erectile dysfunction, unspecified: Secondary | ICD-10-CM

## 2016-04-02 DIAGNOSIS — N138 Other obstructive and reflux uropathy: Secondary | ICD-10-CM

## 2016-04-02 DIAGNOSIS — N401 Enlarged prostate with lower urinary tract symptoms: Secondary | ICD-10-CM

## 2016-04-02 MED ORDER — DOXAZOSIN MESYLATE 1 MG PO TABS: 1.0000 mg | ORAL_TABLET | Freq: Every day | ORAL | 3 refills | Status: DC

## 2016-04-02 MED ORDER — SILDENAFIL CITRATE 20 MG PO TABS: ORAL_TABLET | ORAL | 3 refills | Status: DC

## 2016-04-02 MED ORDER — FINASTERIDE 5 MG PO TABS: 5.0000 mg | ORAL_TABLET | Freq: Every day | ORAL | 3 refills | Status: DC

## 2016-04-18 ENCOUNTER — Encounter: Payer: Self-pay | Admitting: Family Medicine

## 2016-04-18 ENCOUNTER — Ambulatory Visit (INDEPENDENT_AMBULATORY_CARE_PROVIDER_SITE_OTHER): Payer: PRIVATE HEALTH INSURANCE | Admitting: Family Medicine

## 2016-04-18 VITALS — BP 128/84 | HR 60 | Temp 97.5°F | Ht 70.0 in | Wt 194.0 lb

## 2016-04-18 DIAGNOSIS — J0100 Acute maxillary sinusitis, unspecified: Secondary | ICD-10-CM | POA: Diagnosis not present

## 2016-04-18 MED ORDER — AMOXICILLIN 500 MG PO CAPS: 500.0000 mg | ORAL_CAPSULE | Freq: Three times a day (TID) | ORAL | 0 refills | Status: DC

## 2016-04-18 NOTE — Progress Notes (Signed)
Name: Jacob Allen   MRN: [id number]    DOB: [birth year]   Date:04/18/2016       Progress Note  Subjective  Chief Complaint  Chief Complaint  Patient presents with  . Cough    X2 days ago woke up in a sweat. Stuffy nose. Low-grade fever. Feels weak and shakey. White spots in back of throat. No production.     Cough  This is a new problem. The current episode started in the past 7 days. The problem has been gradually worsening. The problem occurs hourly. The cough is non-productive. Associated symptoms include chills, a fever, nasal congestion and postnasal drip. Pertinent negatives include no chest pain, ear pain, headaches, heartburn, myalgias, rash, sore throat, shortness of breath, weight loss or wheezing. Nothing aggravates the symptoms. He has tried OTC cough suppressant for the symptoms. The treatment provided no relief. There is no history of environmental allergies.    No problem-specific Assessment & Plan notes found for this encounter.   Past Medical History:  Diagnosis Date  . Arthritis   . Benign neoplasm of prostate   . Benign prostate hyperplasia   . BPH (benign prostatic hyperplasia)   . Bronchitis   . Lumbosacral ligament sprain   . Nocturia   . Osteoarthritis    lumbar spine    Past Surgical History:  Procedure Laterality Date  . COLONOSCOPY  03/2013   cleared for 10 yrs- Dr Allen Norris  . SHOULDER SURGERY Right 1990    Family History  Problem Relation Age of Onset  . Kidney disease Neg Hx   . Prostate cancer Neg Hx     Social History   Social History  . Marital status: Married    Spouse name: N/A  . Number of children: N/A  . Years of education: N/A   Occupational History  . Not on file.   Social History Main Topics  . Smoking status: Former Research scientist (life sciences)  . Smokeless tobacco: Never Used     Comment: quit 2 years ago  . Alcohol use 0.0 oz/week     Comment: every day  . Drug use: No  . Sexual activity: Yes   Other Topics Concern  . Not on file    Social History Narrative  . No narrative on file    No Known Allergies  Outpatient Medications Prior to Visit  Medication Sig Dispense Refill  . doxazosin (CARDURA) 1 MG tablet Take 1 tablet (1 mg total) by mouth daily. 90 tablet 3  . finasteride (PROSCAR) 5 MG tablet Take 1 tablet (5 mg total) by mouth daily. 90 tablet 3  . ibuprofen (ADVIL,MOTRIN) 800 MG tablet Take 800 mg by mouth every 8 (eight) hours as needed.    . gabapentin (NEURONTIN) 100 MG capsule Take 2 capsules by mouth at bedtime. Reche Dixon    . meloxicam (MOBIC) 15 MG tablet Take 1 tablet (15 mg total) by mouth daily. (Patient not taking: Reported on 04/02/2016) 30 tablet 0  . sildenafil (REVATIO) 20 MG tablet Take 3 to 5 tablets two hours before intercouse on an empty stomach.  Do not take with nitrates. (Patient not taking: Reported on 04/18/2016) 50 tablet 3   No facility-administered medications prior to visit.     Review of Systems  Constitutional: Positive for chills and fever. Negative for malaise/fatigue and weight loss.  HENT: Positive for postnasal drip. Negative for ear discharge, ear pain and sore throat.   Eyes: Negative for blurred vision.  Respiratory: Positive for cough. Negative  for sputum production, shortness of breath and wheezing.   Cardiovascular: Negative for chest pain, palpitations and leg swelling.  Gastrointestinal: Negative for abdominal pain, blood in stool, constipation, diarrhea, heartburn, melena and nausea.  Genitourinary: Negative for dysuria, frequency, hematuria and urgency.  Musculoskeletal: Negative for back pain, joint pain, myalgias and neck pain.  Skin: Negative for rash.  Neurological: Negative for dizziness, tingling, sensory change, focal weakness and headaches.  Endo/Heme/Allergies: Negative for environmental allergies and polydipsia. Does not bruise/bleed easily.  Psychiatric/Behavioral: Negative for depression and suicidal ideas. The patient is not nervous/anxious and does  not have insomnia.      Objective  Vitals:   04/18/16 1154  BP: 128/84  Pulse: 60  Temp: 97.5 F (36.4 C)  SpO2: 97%  Weight: 194 lb (88 kg)  Height: 5\' 10"  (1.778 m)    Physical Exam  Constitutional: He is oriented to person, place, and time and well-developed, well-nourished, and in no distress.  HENT:  Head: Normocephalic.  Right Ear: External ear normal.  Left Ear: External ear normal.  Nose: Nose normal.  Mouth/Throat: Oropharynx is clear and moist.  Eyes: Conjunctivae and EOM are normal. Pupils are equal, round, and reactive to light. Right eye exhibits no discharge. Left eye exhibits no discharge. No scleral icterus.  Neck: Normal range of motion. Neck supple. No JVD present. No tracheal deviation present. No thyromegaly present.  Cardiovascular: Normal rate, regular rhythm, normal heart sounds and intact distal pulses.  Exam reveals no gallop and no friction rub.   No murmur heard. Pulmonary/Chest: Breath sounds normal. No respiratory distress. He has no wheezes. He has no rales. He exhibits no tenderness.  Abdominal: Soft. Bowel sounds are normal. He exhibits no mass. There is no hepatosplenomegaly. There is no tenderness. There is no rebound, no guarding and no CVA tenderness.  Musculoskeletal: Normal range of motion. He exhibits no edema or tenderness.  Lymphadenopathy:    He has no cervical adenopathy.  Neurological: He is alert and oriented to person, place, and time. He has normal sensation, normal strength, normal reflexes and intact cranial nerves. No cranial nerve deficit.  Skin: Skin is warm. No rash noted.  Psychiatric: Mood and affect normal.  Nursing note and vitals reviewed.     Assessment & Plan  Problem List Items Addressed This Visit    None    Visit Diagnoses    Acute maxillary sinusitis, recurrence not specified    -  Primary   Relevant Medications   amoxicillin (AMOXIL) 500 MG capsule      Meds ordered this encounter  Medications  .  amoxicillin (AMOXIL) 500 MG capsule    Sig: Take 1 capsule (500 mg total) by mouth 3 (three) times daily.    Dispense:  30 capsule    Refill:  0      Dr. Otilio Miu [facility]  04/18/16

## 2016-06-24 ENCOUNTER — Other Ambulatory Visit: Payer: Self-pay | Admitting: Family Medicine

## 2016-06-25 ENCOUNTER — Telehealth: Payer: Self-pay

## 2016-06-25 NOTE — Telephone Encounter (Signed)
Called to advise patient need to be seen for sinus infection in order to get Abx. Left message to call back and get on schedule with Dr.Jones.

## 2016-09-11 ENCOUNTER — Ambulatory Visit (INDEPENDENT_AMBULATORY_CARE_PROVIDER_SITE_OTHER): Payer: PRIVATE HEALTH INSURANCE | Admitting: Family Medicine

## 2016-09-11 ENCOUNTER — Encounter: Payer: Self-pay | Admitting: Family Medicine

## 2016-09-11 VITALS — BP 120/82 | HR 72 | Ht 70.0 in | Wt 198.0 lb

## 2016-09-11 DIAGNOSIS — Z114 Encounter for screening for human immunodeficiency virus [HIV]: Secondary | ICD-10-CM | POA: Diagnosis not present

## 2016-09-11 DIAGNOSIS — Z0000 Encounter for general adult medical examination without abnormal findings: Secondary | ICD-10-CM

## 2016-09-11 DIAGNOSIS — Z23 Encounter for immunization: Secondary | ICD-10-CM | POA: Diagnosis not present

## 2016-09-11 DIAGNOSIS — Z1159 Encounter for screening for other viral diseases: Secondary | ICD-10-CM

## 2016-09-11 LAB — HEMOCCULT GUIAC POC 1CARD (OFFICE): Fecal Occult Blood, POC: NEGATIVE

## 2016-09-11 LAB — POCT URINALYSIS DIPSTICK
BILIRUBIN UA: NEGATIVE
Glucose, UA: NEGATIVE
Ketones, UA: NEGATIVE
Leukocytes, UA: NEGATIVE
Nitrite, UA: NEGATIVE
PH UA: 5 (ref 5.0–8.0)
Protein, UA: NEGATIVE
RBC UA: NEGATIVE
SPEC GRAV UA: 1.01 (ref 1.010–1.025)
Urobilinogen, UA: 0.2 E.U./dL

## 2016-09-11 NOTE — Progress Notes (Signed)
Name: LUCIEN BUDNEY   MRN: [id number]    DOB: 1955/03/03   Date:09/11/2016       Progress Note  Subjective  Chief Complaint  Chief Complaint  Patient presents with  . Annual Exam    Patient presents for annual physical exam.    No problem-specific Assessment & Plan notes found for this encounter.   Past Medical History:  Diagnosis Date  . Arthritis   . Benign neoplasm of prostate   . Benign prostate hyperplasia   . BPH (benign prostatic hyperplasia)   . Bronchitis   . Lumbosacral ligament sprain   . Nocturia   . Osteoarthritis    lumbar spine    Past Surgical History:  Procedure Laterality Date  . COLONOSCOPY  03/2013   cleared for 10 yrs- Dr Allen Norris  . SHOULDER SURGERY Right 1990    Family History  Problem Relation Age of Onset  . Kidney disease Neg Hx   . Prostate cancer Neg Hx     Social History   Social History  . Marital status: Married    Spouse name: N/A  . Number of children: N/A  . Years of education: N/A   Occupational History  . Not on file.   Social History Main Topics  . Smoking status: Former Research scientist (life sciences)  . Smokeless tobacco: Never Used     Comment: quit 2 years ago  . Alcohol use 0.0 oz/week     Comment: every day  . Drug use: No  . Sexual activity: Yes   Other Topics Concern  . Not on file   Social History Narrative  . No narrative on file    No Known Allergies  Outpatient Medications Prior to Visit  Medication Sig Dispense Refill  . doxazosin (CARDURA) 1 MG tablet Take 1 tablet (1 mg total) by mouth daily. 90 tablet 3  . finasteride (PROSCAR) 5 MG tablet Take 1 tablet (5 mg total) by mouth daily. 90 tablet 3  . ibuprofen (ADVIL,MOTRIN) 800 MG tablet TAKE ONE (1) TABLET THREE (3) TIMES EACH DAY 90 tablet 0  . gabapentin (NEURONTIN) 100 MG capsule Take 2 capsules by mouth at bedtime. Reche Dixon    . amoxicillin (AMOXIL) 500 MG capsule Take 1 capsule (500 mg total) by mouth 3 (three) times daily. 30 capsule 0   No  facility-administered medications prior to visit.     Review of Systems  Constitutional: Negative for chills, fever, malaise/fatigue and weight loss.  HENT: Negative for ear discharge, ear pain and sore throat.   Eyes: Negative for blurred vision.  Respiratory: Negative for cough, sputum production, shortness of breath and wheezing.   Cardiovascular: Negative for chest pain, palpitations and leg swelling.  Gastrointestinal: Negative for abdominal pain, blood in stool, constipation, diarrhea, heartburn, melena and nausea.  Genitourinary: Negative for dysuria, frequency, hematuria and urgency.  Musculoskeletal: Negative for back pain, joint pain, myalgias and neck pain.  Skin: Negative for rash.  Neurological: Negative for dizziness, tingling, sensory change, focal weakness and headaches.  Endo/Heme/Allergies: Negative for environmental allergies and polydipsia. Does not bruise/bleed easily.  Psychiatric/Behavioral: Negative for depression and suicidal ideas. The patient is not nervous/anxious and does not have insomnia.      Objective  Vitals:   09/11/16 0837  BP: 120/82  Pulse: 72  Weight: 198 lb (89.8 kg)  Height: 5\' 10"  (1.778 m)    Physical Exam  Constitutional: He is oriented to person, place, and time and well-developed, well-nourished, and in no distress. Vital signs  are normal.  HENT:  Head: Normocephalic and atraumatic.  Right Ear: Tympanic membrane, external ear and ear canal normal. No decreased hearing is noted.  Left Ear: Tympanic membrane, external ear and ear canal normal. No decreased hearing is noted.  Nose: Nose normal.  Mouth/Throat: Uvula is midline, oropharynx is clear and moist and mucous membranes are normal. Normal dentition. No oropharyngeal exudate, posterior oropharyngeal edema or posterior oropharyngeal erythema.  Eyes: Pupils are equal, round, and reactive to light. Conjunctivae, EOM and lids are normal. Lids are everted and swept, no foreign bodies  found. Right eye exhibits no discharge. Left eye exhibits no discharge. No scleral icterus.  Fundoscopic exam:      The right eye shows no arteriolar narrowing and no AV nicking.       The left eye shows no arteriolar narrowing and no AV nicking.  Neck: Normal range of motion and phonation normal. Neck supple. Normal carotid pulses, no hepatojugular reflux and no JVD present. Carotid bruit is not present. No tracheal deviation present. No thyroid mass and no thyromegaly present.  Cardiovascular: Normal rate, regular rhythm, S1 normal, S2 normal, normal heart sounds, intact distal pulses and normal pulses.  PMI is not displaced.  Exam reveals no gallop, no S3, no S4, no friction rub and no decreased pulses.   No murmur heard. Pulmonary/Chest: Effort normal and breath sounds normal. No respiratory distress. He has no decreased breath sounds. He has no wheezes. He has no rhonchi. He has no rales.  Abdominal: Soft. Normal aorta and bowel sounds are normal. He exhibits no mass. There is no hepatosplenomegaly. There is no tenderness. There is no rebound, no guarding and no CVA tenderness.  Genitourinary: Rectum normal, prostate normal, testes/scrotum normal and penis normal.  Musculoskeletal: Normal range of motion. He exhibits no edema or tenderness.       Cervical back: Normal.       Thoracic back: Normal.       Lumbar back: Normal.  Lymphadenopathy:       Head (right side): No submental and no submandibular adenopathy present.       Head (left side): No submental and no submandibular adenopathy present.    He has no cervical adenopathy.    He has no axillary adenopathy.  Neurological: He is alert and oriented to person, place, and time. He has normal motor skills, normal sensation, normal strength, normal reflexes and intact cranial nerves. No cranial nerve deficit.  Skin: Skin is warm, dry and intact. No rash noted.  Psychiatric: Mood and affect normal.  Nursing note and vitals  reviewed.     Assessment & Plan  Problem List Items Addressed This Visit      Other   Annual physical exam - Primary   Relevant Orders   POCT occult blood stool (Completed)   POCT urinalysis dipstick (Completed)   Renal Function Panel   Lipid Profile    Other Visit Diagnoses    Need for hepatitis C screening test       Relevant Orders   Hepatitis C antibody   Encounter for screening for HIV       Relevant Orders   HIV antibody   Need for diphtheria-tetanus-pertussis (Tdap) vaccine       Relevant Orders   Tdap vaccine greater than or equal to 7yo IM (Completed)      No orders of the defined types were placed in this encounter.     Dr. [facility]  09/11/16

## 2016-09-12 LAB — RENAL FUNCTION PANEL
Albumin: 4.5 g/dL (ref 3.5–5.5)
BUN / CREAT RATIO: 15 (ref 9–20)
BUN: 12 mg/dL (ref 6–24)
CALCIUM: 9.1 mg/dL (ref 8.7–10.2)
CO2: 24 mmol/L (ref 20–29)
CREATININE: 0.78 mg/dL (ref 0.76–1.27)
Chloride: 101 mmol/L (ref 96–106)
GFR calc Af Amer: 115 mL/min/{1.73_m2} (ref >59)
GFR, EST NON AFRICAN AMERICAN: 99 mL/min/{1.73_m2} (ref >59)
GLUCOSE: 89 mg/dL (ref 65–99)
Phosphorus: 2.9 mg/dL (ref 2.5–4.5)
Potassium: 4.5 mmol/L (ref 3.5–5.2)
SODIUM: 140 mmol/L (ref 134–144)

## 2016-09-12 LAB — LIPID PANEL
CHOL/HDL RATIO: 3.4 ratio (ref 0.0–5.0)
CHOLESTEROL TOTAL: 221 mg/dL — AB (ref 100–199)
HDL: 65 mg/dL (ref >39)
LDL CALC: 145 mg/dL — AB (ref 0–99)
TRIGLYCERIDES: 53 mg/dL (ref 0–149)
VLDL CHOLESTEROL CAL: 11 mg/dL (ref 5–40)

## 2016-09-12 LAB — HEPATITIS C ANTIBODY: Hep C Virus Ab: <0.1 s/co ratio (ref 0.0–0.9)

## 2016-09-12 LAB — HIV ANTIBODY (ROUTINE TESTING W REFLEX): HIV SCREEN 4TH GENERATION: NONREACTIVE

## 2017-01-30 ENCOUNTER — Other Ambulatory Visit: Payer: Self-pay | Admitting: Family Medicine

## 2017-02-09 ENCOUNTER — Other Ambulatory Visit: Payer: Self-pay | Admitting: Urology

## 2017-02-09 DIAGNOSIS — N400 Enlarged prostate without lower urinary tract symptoms: Secondary | ICD-10-CM

## 2017-03-27 ENCOUNTER — Other Ambulatory Visit: Payer: Self-pay

## 2017-03-27 DIAGNOSIS — N401 Enlarged prostate with lower urinary tract symptoms: Secondary | ICD-10-CM

## 2017-03-31 ENCOUNTER — Other Ambulatory Visit: Payer: PRIVATE HEALTH INSURANCE

## 2017-04-01 ENCOUNTER — Other Ambulatory Visit: Payer: PRIVATE HEALTH INSURANCE

## 2017-04-01 DIAGNOSIS — N401 Enlarged prostate with lower urinary tract symptoms: Secondary | ICD-10-CM

## 2017-04-01 NOTE — Progress Notes (Signed)
8:41 AM   Jacob Allen 1955-02-28 [id number]  Referring provider: Juline Patch, MD [address] [place], [place] [address]  Chief Complaint  Patient presents with  . Benign Prostatic Hypertrophy  . Erectile Dysfunction  . Follow-up    HPI: 62 yo WM with BPH with LU TS and ED who present for a 1 year follow up.  BPH WITH LUTS His IPSS score today is 5, which is mild lower urinary tract symptomatology.  He is pleased with his quality life due to his urinary symptoms.  His previous I PSS score was 4/2.  His previous PVR was 0 mL.  He denies any dysuria, hematuria or suprapubic pain.   He currently taking doxazosin 1 mg daily and finasteride 5 mg daily.  We had discussed in the past that his Cardura dose was sub therapeutic, but he did not want to increase the dosing.  He also denies any recent fevers, chills, nausea or vomiting.  He does not have a family history of PCa. IPSS    Row Name 04/02/17 0800         International Prostate Symptom Score   How often have you had the sensation of not emptying your bladder?  Not at All     How often have you had to urinate less than every two hours?  Less than 1 in 5 times     How often have you found you stopped and started again several times when you urinated?  Less than 1 in 5 times     How often have you found it difficult to postpone urination?  Less than 1 in 5 times     How often have you had a weak urinary stream?  Less than 1 in 5 times     How often have you had to strain to start urination?  Not at All     How many times did you typically get up at night to urinate?  1 Time     Total IPSS Score  5       Quality of Life due to urinary symptoms   If you were to spend the rest of your life with your urinary condition just the way it is now how would you feel about that?  Pleased        Score:  1-7 Mild 8-19 Moderate 20-35 Severe  Erectile dysfunction His SHIM score is 15, which is mild to moderate ED.   He  has been having difficulty with erections for one year.   His previous SHIM score was 14.  His major complaint is achieving and maintaining an erection.  His libido is preserved.   His risk factors for ED are age and BPH.   He denies any painful erections or curvatures with his erections.   He is still having spontaneous erections.   He has tried Viagra in the past and it was effective.   He states that the sildenafil gives him a mild headache and dry eyes.   SHIM    Row Name 04/02/17 0826         SHIM: Over the last 6 months:   How do you rate your confidence that you could get and keep an erection?  Moderate     When you had erections with sexual stimulation, how often were your erections hard enough for penetration (entering your partner)?  Sometimes (about half the time)     During sexual intercourse, how often  were you able to maintain your erection after you had penetrated (entered) your partner?  A Few Times (much less than half the time)     During sexual intercourse, how difficult was it to maintain your erection to completion of intercourse?  Slightly Difficult     When you attempted sexual intercourse, how often was it satisfactory for you?  Sometimes (about half the time)       SHIM Total Score   SHIM  15        Score: 1-7 Severe ED 8-11 Moderate ED 12-16 Mild-Moderate ED 17-21 Mild ED 22-25 No ED   PMH: Past Medical History:  Diagnosis Date  . Arthritis   . Benign neoplasm of prostate   . Benign prostate hyperplasia   . BPH (benign prostatic hyperplasia)   . Bronchitis   . Lumbosacral ligament sprain   . Nocturia   . Osteoarthritis    lumbar spine    Surgical History: Past Surgical History:  Procedure Laterality Date  . COLONOSCOPY  03/2013   cleared for 10 yrs- Dr Allen Norris  . SHOULDER SURGERY Right 1990    Home Medications:  Allergies as of 04/02/2017   No Known Allergies     Medication List        Accurate as of 04/02/17  8:41 AM. Always use your most  recent med list.          doxazosin 1 MG tablet Commonly known as:  CARDURA Take 1 tablet (1 mg total) by mouth daily.   finasteride 5 MG tablet Commonly known as:  PROSCAR TAKE ONE (1) TABLET BY MOUTH ONCE DAILY   gabapentin 100 MG capsule Commonly known as:  NEURONTIN Take 2 capsules by mouth at bedtime. Todd mundy   gabapentin 100 MG capsule Commonly known as:  NEURONTIN Take 2 capsules by mouth at bedtime. Mundy   ibuprofen 800 MG tablet Commonly known as:  ADVIL,MOTRIN TAKE (1) TABLET BY MOUTH THREE TIMES A DAY   meloxicam 7.5 MG tablet Commonly known as:  MOBIC Take 1 tablet by mouth daily. Mundy   sildenafil 20 MG tablet Commonly known as:  REVATIO Take 3 to 5 tablets two hours before intercouse on an empty stomach.  Do not take with nitrates.   tadalafil 20 MG tablet Commonly known as:  CIALIS Take 1 tablet (20 mg total) by mouth daily as needed for erectile dysfunction.       Allergies: No Known Allergies  Family History: Family History  Problem Relation Age of Onset  . Kidney disease Neg Hx   . Prostate cancer Neg Hx     Social History:  reports that he has quit smoking. he has never used smokeless tobacco. He reports that he drinks alcohol. He reports that he does not use drugs.  ROS: UROLOGY Frequent Urination?: No Hard to postpone urination?: No Burning/pain with urination?: No Get up at night to urinate?: No Leakage of urine?: No Urine stream starts and stops?: No Trouble starting stream?: No Do you have to strain to urinate?: No Blood in urine?: No Urinary tract infection?: No Sexually transmitted disease?: No Injury to kidneys or bladder?: No Painful intercourse?: No Weak stream?: No Erection problems?: No Penile pain?: No  Gastrointestinal Nausea?: No Vomiting?: No Indigestion/heartburn?: No Diarrhea?: No Constipation?: No  Constitutional Fever: No Night sweats?: No Weight loss?: No Fatigue?: No  Skin Skin  rash/lesions?: No Itching?: No  Eyes Blurred vision?: No Double vision?: No  Ears/Nose/Throat Sore throat?: No Sinus problems?:  No  Hematologic/Lymphatic Swollen glands?: No Easy bruising?: No  Cardiovascular Leg swelling?: No Chest pain?: No  Respiratory Cough?: No Shortness of breath?: No  Endocrine Excessive thirst?: No  Musculoskeletal Back pain?: No Joint pain?: No  Neurological Headaches?: No Dizziness?: No  Psychologic Depression?: No Anxiety?: No  Physical Exam: BP 125/77   Pulse 63   Ht 5\' 10"  (1.778 m)   Wt 200 lb (90.7 kg)   BMI 28.70 kg/m   Constitutional: Well nourished. Alert and oriented, No acute distress. HEENT: [place] AT, moist mucus membranes. Trachea midline, no masses. Cardiovascular: No clubbing, cyanosis, or edema. Respiratory: Normal respiratory effort, no increased work of breathing. GI: Abdomen is soft, non tender, non distended, no abdominal masses. Liver and spleen not palpable.  No hernias appreciated.  Stool sample for occult testing is not indicated.   GU: No CVA tenderness.  No bladder fullness or masses.  Patient with circumcised phallus. Urethral meatus is patent.  No penile discharge. No penile lesions or rashes. Scrotum without lesions, cysts, rashes and/or edema.  Testicles are located scrotally bilaterally. No masses are appreciated in the testicles. Left and right epididymis are normal. Rectal: Patient with  normal sphincter tone. Anus and perineum without scarring or rashes. No rectal masses are appreciated. Prostate is approximately 50 grams, no nodules are appreciated. Seminal vesicles are normal. Skin: No rashes, bruises or suspicious lesions. Lymph: No cervical or inguinal adenopathy. Neurologic: Grossly intact, no focal deficits, moving all 4 extremities. Psychiatric: Normal mood and affect.    Laboratory Data: Lab Results  Component Value Date   CREATININE 0.78 09/11/2016   PSA History   0.3 ng/mL on  02/09/2014   0.4 ng/mL on 08/15/2014   0.3 ng/mL on 02/15/2015   0.5 ng/mL on 03/26/2016   0.4 ng/mL on 04/01/2017  I have reviewed the labs.  Assessment & Plan:    1. BPH with LUTS  - IPSS score is 5/0, it is worsening  - Continue conservative management, avoiding bladder irritants and timed voiding's  - Discontinue doxazosin 1 mg daily   - Continue finasteride 5 mg daily:refills given  - RTC in 12 months for IPSS, PSA and exam   2. Erectile dysfunction  - SHIM score is 15, it is improving  - Continue sildenafil 20 mg, 3 to 5 tablets two hours prior to intercourse on an empty stomach, # 50  - Given script for Cialis 20 mg prn to see if it is more effective  - RTC in 12 months for repeat SHIM score and exam   Return in about 1 year (around 04/03/2018) for IPSS, SHIM, PSA and exam.  Zara Council, [facility]  [facility] [address] [place], [place] [address] [phone]

## 2017-04-02 ENCOUNTER — Ambulatory Visit (INDEPENDENT_AMBULATORY_CARE_PROVIDER_SITE_OTHER): Payer: PRIVATE HEALTH INSURANCE | Admitting: Urology

## 2017-04-02 ENCOUNTER — Encounter: Payer: Self-pay | Admitting: Urology

## 2017-04-02 VITALS — BP 125/77 | HR 63 | Ht 70.0 in | Wt 200.0 lb

## 2017-04-02 DIAGNOSIS — N529 Male erectile dysfunction, unspecified: Secondary | ICD-10-CM | POA: Diagnosis not present

## 2017-04-02 DIAGNOSIS — N401 Enlarged prostate with lower urinary tract symptoms: Secondary | ICD-10-CM

## 2017-04-02 DIAGNOSIS — N138 Other obstructive and reflux uropathy: Secondary | ICD-10-CM

## 2017-04-02 DIAGNOSIS — N400 Enlarged prostate without lower urinary tract symptoms: Secondary | ICD-10-CM | POA: Diagnosis not present

## 2017-04-02 LAB — PSA: PROSTATE SPECIFIC AG, SERUM: 0.4 ng/mL (ref 0.0–4.0)

## 2017-04-02 MED ORDER — SILDENAFIL CITRATE 20 MG PO TABS: ORAL_TABLET | ORAL | 3 refills | Status: DC

## 2017-04-02 MED ORDER — FINASTERIDE 5 MG PO TABS: ORAL_TABLET | ORAL | 3 refills | Status: DC

## 2017-04-02 MED ORDER — TADALAFIL 20 MG PO TABS: 20.0000 mg | ORAL_TABLET | Freq: Every day | ORAL | 0 refills | Status: DC | PRN

## 2017-04-13 ENCOUNTER — Other Ambulatory Visit: Payer: Self-pay

## 2017-04-13 DIAGNOSIS — N400 Enlarged prostate without lower urinary tract symptoms: Secondary | ICD-10-CM

## 2017-04-13 MED ORDER — SILDENAFIL CITRATE 20 MG PO TABS: ORAL_TABLET | ORAL | 3 refills | Status: DC

## 2017-04-13 MED ORDER — FINASTERIDE 5 MG PO TABS: ORAL_TABLET | ORAL | 3 refills | Status: DC

## 2017-09-11 ENCOUNTER — Encounter: Payer: Self-pay | Admitting: Family Medicine

## 2017-09-11 ENCOUNTER — Ambulatory Visit (INDEPENDENT_AMBULATORY_CARE_PROVIDER_SITE_OTHER): Payer: PRIVATE HEALTH INSURANCE | Admitting: Family Medicine

## 2017-09-11 VITALS — BP 132/84 | HR 66 | Ht 70.0 in | Wt 200.0 lb

## 2017-09-11 DIAGNOSIS — E7800 Pure hypercholesterolemia, unspecified: Secondary | ICD-10-CM

## 2017-09-11 DIAGNOSIS — M5412 Radiculopathy, cervical region: Secondary | ICD-10-CM | POA: Diagnosis not present

## 2017-09-11 DIAGNOSIS — Z0000 Encounter for general adult medical examination without abnormal findings: Secondary | ICD-10-CM

## 2017-09-11 MED ORDER — IBUPROFEN 800 MG PO TABS: ORAL_TABLET | ORAL | 3 refills | Status: AC

## 2017-09-11 NOTE — Assessment & Plan Note (Signed)
Chronic Recurrent Cervical pain with referral to upper extremeties.Refill ibuprofen 800mg  daily .

## 2017-09-11 NOTE — Progress Notes (Signed)
Name: Jacob Allen   MRN: [id number]    DOB: 10-25-1955   Date:09/11/2017       Progress Note  Subjective  Chief Complaint  Chief Complaint  Patient presents with  . Annual Exam    Patient is a 62 year old male who presents for a comprehensive physical exam. The patient reports the following problems: tremor. Health maintenance has been reviewed needs influenza.   Cervical radiculitis Chronic Recurrent Cervical pain with referral to upper extremeties.Refill ibuprofen 800mg  daily .   Past Medical History:  Diagnosis Date  . Arthritis   . Benign neoplasm of prostate   . Benign prostate hyperplasia   . BPH (benign prostatic hyperplasia)   . Bronchitis   . Lumbosacral ligament sprain   . Nocturia   . Osteoarthritis    lumbar spine    Past Surgical History:  Procedure Laterality Date  . COLONOSCOPY  03/2013   cleared for 10 yrs- Dr Allen Norris  . SHOULDER SURGERY Right 1990    Family History  Problem Relation Age of Onset  . Kidney disease Neg Hx   . Prostate cancer Neg Hx     Social History   Socioeconomic History  . Marital status: Married    Spouse name: Not on file  . Number of children: Not on file  . Years of education: Not on file  . Highest education level: Not on file  Occupational History  . Not on file  Social Needs  . Financial resource strain: Not on file  . Food insecurity:    Worry: Not on file    Inability: Not on file  . Transportation needs:    Medical: Not on file    Non-medical: Not on file  Tobacco Use  . Smoking status: Former Research scientist (life sciences)  . Smokeless tobacco: Never Used  . Tobacco comment: quit 2 years ago  Substance and Sexual Activity  . Alcohol use: Yes    Alcohol/week: 0.0 standard drinks    Comment: every day  . Drug use: No  . Sexual activity: Yes  Lifestyle  . Physical activity:    Days per week: Not on file    Minutes per session: Not on file  . Stress: Not on file  Relationships  . Social connections:    Talks on phone: Not  on file    Gets together: Not on file    Attends religious service: Not on file    Active member of club or organization: Not on file    Attends meetings of clubs or organizations: Not on file    Relationship status: Not on file  . Intimate partner violence:    Fear of current or ex partner: Not on file    Emotionally abused: Not on file    Physically abused: Not on file    Forced sexual activity: Not on file  Other Topics Concern  . Not on file  Social History Narrative  . Not on file    No Known Allergies  Outpatient Medications Prior to Visit  Medication Sig Dispense Refill  . finasteride (PROSCAR) 5 MG tablet TAKE ONE (1) TABLET BY MOUTH ONCE DAILY 90 tablet 3  . sildenafil (REVATIO) 20 MG tablet Take 3 to 5 tablets two hours before intercouse on an empty stomach.  Do not take with nitrates. 50 tablet 3  . ibuprofen (ADVIL,MOTRIN) 800 MG tablet TAKE (1) TABLET BY MOUTH THREE TIMES A DAY 90 tablet 0  . doxazosin (CARDURA) 1 MG tablet Take 1  tablet (1 mg total) by mouth daily. (Patient not taking: Reported on 09/11/2017) 90 tablet 3  . gabapentin (NEURONTIN) 100 MG capsule Take 2 capsules by mouth at bedtime. Reche Dixon    . gabapentin (NEURONTIN) 100 MG capsule Take 2 capsules by mouth at bedtime. Mundy    . meloxicam (MOBIC) 7.5 MG tablet Take 1 tablet by mouth daily. Mundy    . tadalafil (CIALIS) 20 MG tablet Take 1 tablet (20 mg total) by mouth daily as needed for erectile dysfunction. (Patient not taking: Reported on 09/11/2017) 10 tablet 0   No facility-administered medications prior to visit.     Review of Systems  Constitutional: Negative for chills, fever, malaise/fatigue and weight loss.  HENT: Negative for congestion, ear discharge, ear pain, sinus pain and sore throat.   Eyes: Negative for blurred vision, photophobia, discharge and redness.  Respiratory: Negative for cough, sputum production, shortness of breath and wheezing.   Cardiovascular: Negative for chest  pain, palpitations and leg swelling.  Gastrointestinal: Negative for abdominal pain, blood in stool, constipation, diarrhea, heartburn, melena and nausea.  Genitourinary: Negative for dysuria, frequency, hematuria and urgency.  Musculoskeletal: Negative for back pain, joint pain, myalgias and neck pain.  Skin: Negative for rash.  Neurological: Positive for tremors. Negative for dizziness, tingling, sensory change, speech change, focal weakness, seizures, weakness and headaches.  Endo/Heme/Allergies: Negative for environmental allergies and polydipsia. Does not bruise/bleed easily.  Psychiatric/Behavioral: Negative for depression and suicidal ideas. The patient is not nervous/anxious and does not have insomnia.      Objective  Vitals:   09/11/17 0828  BP: 132/84  Pulse: 66  SpO2: 97%  Weight: 200 lb (90.7 kg)  Height: 5\' 10"  (1.778 m)    Physical Exam  Constitutional: He is oriented to person, place, and time. Vital signs are normal. He appears well-developed and well-nourished. He appears listless.  HENT:  Head: Normocephalic.  Right Ear: Hearing, tympanic membrane, external ear and ear canal normal.  Left Ear: Hearing, tympanic membrane, external ear and ear canal normal.  Nose: Nose normal. No mucosal edema.  Mouth/Throat: Uvula is midline, oropharynx is clear and moist and mucous membranes are normal.  Eyes: Pupils are equal, round, and reactive to light. Conjunctivae, EOM and lids are normal. Lids are everted and swept, no foreign bodies found. Right eye exhibits no discharge. Left eye exhibits no discharge. No scleral icterus.  Fundoscopic exam:      The right eye shows no arteriolar narrowing and no AV nicking.       The left eye shows no arteriolar narrowing and no AV nicking.  Neck: Trachea normal, normal range of motion and full passive range of motion without pain. Neck supple. Normal carotid pulses, no hepatojugular reflux and no JVD present. Carotid bruit is not present.  No tracheal deviation present. No thyroid mass and no thyromegaly present.  Cardiovascular: Normal rate, regular rhythm, S1 normal, S2 normal, normal heart sounds and intact distal pulses. PMI is not displaced. Exam reveals no gallop, no S3, no S4 and no friction rub.  No murmur heard.  No systolic murmur is present.  No diastolic murmur is present. Pulses:      Carotid pulses are 2+ on the right side, and 2+ on the left side.      Radial pulses are 2+ on the right side, and 2+ on the left side.       Femoral pulses are 2+ on the right side, and 2+ on the left side.  Popliteal pulses are 2+ on the right side, and 2+ on the left side.       Dorsalis pedis pulses are 2+ on the right side, and 2+ on the left side.       Posterior tibial pulses are 2+ on the right side, and 2+ on the left side.  Pulmonary/Chest: Breath sounds normal. No respiratory distress. He has no wheezes. He has no rales. Right breast exhibits no mass. Left breast exhibits no mass.  Abdominal: Soft. Normal appearance, normal aorta and bowel sounds are normal. He exhibits no mass. There is no hepatosplenomegaly. There is no tenderness. There is no rigidity, no rebound, no guarding and no CVA tenderness.  Genitourinary: Rectum normal, prostate normal, testes normal and penis normal. Rectal exam shows no external hemorrhoid, no mass, no tenderness and guaiac negative stool. Right testis shows no mass, no swelling and no tenderness. Left testis shows no mass, no swelling and no tenderness.  Musculoskeletal: Normal range of motion. He exhibits no edema or tenderness.       Lumbar back: Normal.  Lymphadenopathy:       Head (right side): No submandibular adenopathy present.       Head (left side): No submandibular adenopathy present.    He has no cervical adenopathy.  Neurological: He is oriented to person, place, and time. He has normal strength and normal reflexes. He appears listless. He displays normal reflexes. A sensory  deficit is present. No cranial nerve deficit. He displays a negative Romberg sign.  Reflex Scores:      Tricep reflexes are 2+ on the right side and 2+ on the left side.      Bicep reflexes are 2+ on the right side and 2+ on the left side.      Brachioradialis reflexes are 2+ on the right side and 2+ on the left side.      Patellar reflexes are 2+ on the right side and 2+ on the left side.      Achilles reflexes are 2+ on the right side and 2+ on the left side. Skin: Skin is warm, dry and intact. No rash noted.  Psychiatric: He has a normal mood and affect. His speech is normal and behavior is normal. Cognition and memory are normal.  Nursing note and vitals reviewed.     Assessment & Plan  Problem List Items Addressed This Visit      Nervous and Auditory   Cervical radiculitis    Chronic Recurrent Cervical pain with referral to upper extremeties.Refill ibuprofen 800mg  daily .      Relevant Medications   ibuprofen (ADVIL,MOTRIN) 800 MG tablet     Other   Annual physical exam - Primary   Relevant Orders   Renal Function Panel   Lipid panel    Other Visit Diagnoses    Pure hypercholesterolemia       Elevated LDL in past(nonfasting) Kathlen Brunswick check lipid panel   Relevant Orders   Lipid panel      Meds ordered this encounter  Medications  . ibuprofen (ADVIL,MOTRIN) 800 MG tablet    Sig: TAKE (1) TABLET BY MOUTH THREE TIMES A DAY    Dispense:  90 tablet    Refill:  3      Dr. Alona Danford [facility]  09/11/17

## 2017-09-12 LAB — RENAL FUNCTION PANEL
Albumin: 4.8 g/dL (ref 3.5–5.5)
BUN/Creatinine Ratio: 17 (ref 9–20)
BUN: 14 mg/dL (ref 6–24)
CO2: 23 mmol/L (ref 20–29)
Calcium: 9.7 mg/dL (ref 8.7–10.2)
Chloride: 99 mmol/L (ref 96–106)
Creatinine, Ser: 0.83 mg/dL (ref 0.76–1.27)
GFR calc Af Amer: 111 mL/min/{1.73_m2} (ref >59)
GFR calc non Af Amer: 96 mL/min/{1.73_m2} (ref >59)
Glucose: 104 mg/dL — ABNORMAL HIGH (ref 65–99)
PHOSPHORUS: 3 mg/dL (ref 2.5–4.5)
POTASSIUM: 4.8 mmol/L (ref 3.5–5.2)
SODIUM: 140 mmol/L (ref 134–144)

## 2017-09-12 LAB — LIPID PANEL
Chol/HDL Ratio: 4.2 ratio (ref 0.0–5.0)
Cholesterol, Total: 271 mg/dL — ABNORMAL HIGH (ref 100–199)
HDL: 64 mg/dL (ref >39)
LDL Calculated: 192 mg/dL — ABNORMAL HIGH (ref 0–99)
TRIGLYCERIDES: 76 mg/dL (ref 0–149)
VLDL Cholesterol Cal: 15 mg/dL (ref 5–40)

## 2018-03-29 ENCOUNTER — Other Ambulatory Visit: Payer: Self-pay

## 2018-03-29 DIAGNOSIS — N400 Enlarged prostate without lower urinary tract symptoms: Secondary | ICD-10-CM

## 2018-03-30 ENCOUNTER — Other Ambulatory Visit: Payer: PRIVATE HEALTH INSURANCE

## 2018-03-30 DIAGNOSIS — N400 Enlarged prostate without lower urinary tract symptoms: Secondary | ICD-10-CM

## 2018-03-31 LAB — PSA: Prostate Specific Ag, Serum: 0.3 ng/mL (ref 0.0–4.0)

## 2018-04-01 ENCOUNTER — Ambulatory Visit (INDEPENDENT_AMBULATORY_CARE_PROVIDER_SITE_OTHER): Payer: PRIVATE HEALTH INSURANCE | Admitting: Urology

## 2018-04-01 ENCOUNTER — Encounter: Payer: Self-pay | Admitting: Urology

## 2018-04-01 VITALS — BP 134/89 | HR 67 | Ht 70.0 in | Wt 200.0 lb

## 2018-04-01 DIAGNOSIS — N529 Male erectile dysfunction, unspecified: Secondary | ICD-10-CM | POA: Diagnosis not present

## 2018-04-01 DIAGNOSIS — N400 Enlarged prostate without lower urinary tract symptoms: Secondary | ICD-10-CM

## 2018-04-01 MED ORDER — FINASTERIDE 5 MG PO TABS: ORAL_TABLET | ORAL | 3 refills | Status: DC

## 2018-04-01 MED ORDER — TADALAFIL 5 MG PO TABS: 5.0000 mg | ORAL_TABLET | Freq: Every day | ORAL | 0 refills | Status: DC | PRN

## 2018-04-01 NOTE — Progress Notes (Signed)
04/01/2018  8:41 AM   Jacob Allen 04/14/1955 [id number]  Referring provider: Juline Patch, MD [address] [place], [place] [address]  Chief Complaint  Patient presents with  . Benign Prostatic Hypertrophy    HPI: 63 yo WM with BPH with LU TS and ED who present for a 1 year follow up.  BPH WITH LUTS His IPSS score today is 6/1, which is mild lower urinary tract symptomatology.  He is pleased with his quality life due to his urinary symptoms.  He reports of nocturia 1x some of the nights and intermittent urinary stream only at night. His previous I PSS score was 5.  His previous PVR was 0 mL. He denies any dysuria, hematuria or suprapubic pain. He had not had any pain since he started taking Finasteride. He currently is on finasteride 5 mg daily. He discontinued use of Cardura. He does not have a family history of PCa.  His most recent PSA is 0.3.   IPSS    Row Name 04/01/18 0800         International Prostate Symptom Score   How often have you had the sensation of not emptying your bladder?  Less than 1 in 5     How often have you had to urinate less than every two hours?  Less than 1 in 5 times     How often have you found you stopped and started again several times when you urinated?  Less than 1 in 5 times     How often have you found it difficult to postpone urination?  Less than 1 in 5 times     How often have you had a weak urinary stream?  Less than 1 in 5 times     How often have you had to strain to start urination?  Not at All     How many times did you typically get up at night to urinate?  1 Time     Total IPSS Score  6       Quality of Life due to urinary symptoms   If you were to spend the rest of your life with your urinary condition just the way it is now how would you feel about that?  Pleased        Score:  1-7 Mild 8-19 Moderate 20-35 Severe  Erectile dysfunction His SHIM score is 17, which is mild to moderate ED.   He has been having  difficulty with erections for one year. His previous SHIM score was 15.  His major complaint is achieving and maintaining an erection.  His libido is preserved.   His risk factors for ED are age and BPH. He denies any painful erections or curvatures with his erections. He is still having spontaneous erections. He reports of taking Sildenafil only when he needs to.   SHIM    Row Name 04/01/18 0818         SHIM: Over the last 6 months:   How do you rate your confidence that you could get and keep an erection?  Moderate     When you had erections with sexual stimulation, how often were your erections hard enough for penetration (entering your partner)?  Sometimes (about half the time)     During sexual intercourse, how often were you able to maintain your erection after you had penetrated (entered) your partner?  Most Times (much more than half the time)     During sexual  intercourse, how difficult was it to maintain your erection to completion of intercourse?  Slightly Difficult     When you attempted sexual intercourse, how often was it satisfactory for you?  Sometimes (about half the time)       SHIM Total Score   SHIM  17        Score: 1-7 Severe ED 8-11 Moderate ED 12-16 Mild-Moderate ED 17-21 Mild ED 22-25 No ED   PMH: Past Medical History:  Diagnosis Date  . Arthritis   . Benign neoplasm of prostate   . Benign prostate hyperplasia   . BPH (benign prostatic hyperplasia)   . Bronchitis   . Lumbosacral ligament sprain   . Nocturia   . Osteoarthritis    lumbar spine    Surgical History: Past Surgical History:  Procedure Laterality Date  . COLONOSCOPY  03/2013   cleared for 10 yrs- Dr Allen Norris  . SHOULDER SURGERY Right 1990    Home Medications:  Allergies as of 04/01/2018   No Known Allergies     Medication List       Accurate as of April 01, 2018  8:41 AM. Always use your most recent med list.        doxazosin 1 MG tablet Commonly known as:  CARDURA Take 1  tablet (1 mg total) by mouth daily.   finasteride 5 MG tablet Commonly known as:  PROSCAR TAKE ONE (1) TABLET BY MOUTH ONCE DAILY   gabapentin 100 MG capsule Commonly known as:  NEURONTIN Take 2 capsules by mouth at bedtime. Todd mundy   gabapentin 100 MG capsule Commonly known as:  NEURONTIN Take 2 capsules by mouth at bedtime. Mundy   ibuprofen 800 MG tablet Commonly known as:  ADVIL,MOTRIN TAKE (1) TABLET BY MOUTH THREE TIMES A DAY   meloxicam 7.5 MG tablet Commonly known as:  MOBIC Take 1 tablet by mouth daily. Mundy   sildenafil 20 MG tablet Commonly known as:  REVATIO Take 3 to 5 tablets two hours before intercouse on an empty stomach.  Do not take with nitrates.   tadalafil 5 MG tablet Commonly known as:  CIALIS Take 1 tablet (5 mg total) by mouth daily as needed for erectile dysfunction.       Allergies: No Known Allergies  Family History: Family History  Problem Relation Age of Onset  . Kidney disease Neg Hx   . Prostate cancer Neg Hx     Social History:  reports that he has quit smoking. He has never used smokeless tobacco. He reports current alcohol use. He reports that he does not use drugs.  ROS: UROLOGY Frequent Urination?: No Hard to postpone urination?: No Burning/pain with urination?: No Get up at night to urinate?: Yes(1) Leakage of urine?: No Urine stream starts and stops?: Yes(sometimes only at night) Trouble starting stream?: No Do you have to strain to urinate?: No Blood in urine?: No Urinary tract infection?: No Sexually transmitted disease?: No Injury to kidneys or bladder?: No Painful intercourse?: No Weak stream?: No Erection problems?: Yes Penile pain?: No  Gastrointestinal Nausea?: No Vomiting?: No Indigestion/heartburn?: No Diarrhea?: No Constipation?: No  Constitutional Fever: No Night sweats?: No Weight loss?: No Fatigue?: No  Skin Skin rash/lesions?: No Itching?: No  Eyes Blurred vision?: No Double  vision?: No  Ears/Nose/Throat Sore throat?: No Sinus problems?: No  Hematologic/Lymphatic Swollen glands?: No Easy bruising?: No  Cardiovascular Leg swelling?: No Chest pain?: No  Respiratory Cough?: No Shortness of breath?: No  Endocrine Excessive thirst?:  No  Musculoskeletal Back pain?: No Joint pain?: No  Neurological Headaches?: No Dizziness?: No  Psychologic Depression?: No Anxiety?: No  Physical Exam: BP 134/89 (BP Location: Left Arm, Patient Position: Sitting)   Pulse 67   Ht 5\' 10"  (1.778 m)   Wt 200 lb (90.7 kg)   BMI 28.70 kg/m   Constitutional:  Well nourished. Alert and oriented, No acute distress. HEENT: [place] AT, moist mucus membranes.  Trachea midline, no masses. Cardiovascular: No clubbing, cyanosis, or edema. Respiratory: Normal respiratory effort, no increased work of breathing. GU: No CVA tenderness.  No bladder fullness or masses.  Patient with circumcised phallus. Urethral meatus is patent.  No penile discharge. No penile lesions or rashes. Scrotum without lesions, cysts, rashes and/or edema.  Testicles are located scrotally bilaterally. No masses are appreciated in the testicles. Left and right epididymis are normal. Rectal: Patient with  normal sphincter tone. Anus and perineum without scarring or rashes. No rectal masses are appreciated. Prostate is approximately 60 grams, no nodules are appreciated. Could only palpate mid portion and apex.  Skin: No rashes, bruises or suspicious lesions. Neurologic: Grossly intact, no focal deficits, moving all 4 extremities. Psychiatric: Normal mood and affect.  Laboratory Data:  PSA History   0.3 ng/mL on 02/09/2014   0.4 ng/mL on 08/15/2014   0.3 ng/mL on 02/15/2015   0.5 ng/mL on 03/26/2016   0.4 ng/mL on 04/01/2017   0.3 ng/mL on 03/30/2018  I have reviewed the labs.  Assessment & Plan:    1. BPH with LUTS  - IPSS score is 6/1, it is worsening  - Most recent PSA 0.3   - Continue  conservative management, avoiding bladder irritants and timed voiding's  - Continue finasteride 5 mg daily, refills given; medication effective   - RTC in 12 months for IPSS, PSA and exam   2. Erectile dysfunction  - SHIM score is 17, it is improving  - Continue sildenafil 20 mg, 3 to 5 tablets two hours prior to intercourse on an empty stomach, # 50; pt only uses when he needs it   - Given script for Cialis 5 mg prn to see if it is more effective  - RTC in 12 months for repeat SHIM score and exam   Return in about 1 year (around 04/01/2019) for IPSS, SHIM, PSA and exam.  Zara Council, [facility]  [address] [place], [place] [address] [phone]  I, Lucas Mallow, am acting as a Education administrator for [employer],  I have reviewed the above documentation for accuracy and completeness, and I agree with the above.    Zara Council, PA-C

## 2018-09-14 ENCOUNTER — Encounter: Payer: PRIVATE HEALTH INSURANCE | Admitting: Family Medicine

## 2019-03-16 ENCOUNTER — Other Ambulatory Visit: Payer: Self-pay | Admitting: Urology

## 2019-03-16 DIAGNOSIS — N400 Enlarged prostate without lower urinary tract symptoms: Secondary | ICD-10-CM

## 2019-04-01 ENCOUNTER — Ambulatory Visit: Payer: PRIVATE HEALTH INSURANCE | Admitting: Urology

## 2020-08-09 ENCOUNTER — Other Ambulatory Visit: Payer: Self-pay

## 2020-08-09 ENCOUNTER — Encounter: Payer: Self-pay | Admitting: Family Medicine

## 2020-08-09 ENCOUNTER — Ambulatory Visit (INDEPENDENT_AMBULATORY_CARE_PROVIDER_SITE_OTHER): Payer: 59 | Admitting: Family Medicine

## 2020-08-09 VITALS — BP 120/70 | HR 60 | Ht 70.0 in | Wt 203.0 lb

## 2020-08-09 DIAGNOSIS — N529 Male erectile dysfunction, unspecified: Secondary | ICD-10-CM | POA: Diagnosis not present

## 2020-08-09 DIAGNOSIS — Z1211 Encounter for screening for malignant neoplasm of colon: Secondary | ICD-10-CM

## 2020-08-09 DIAGNOSIS — N401 Enlarged prostate with lower urinary tract symptoms: Secondary | ICD-10-CM | POA: Diagnosis not present

## 2020-08-09 DIAGNOSIS — N138 Other obstructive and reflux uropathy: Secondary | ICD-10-CM

## 2020-08-09 DIAGNOSIS — Z0000 Encounter for general adult medical examination without abnormal findings: Secondary | ICD-10-CM

## 2020-08-09 MED ORDER — SILDENAFIL CITRATE 20 MG PO TABS: 20.0000 mg | ORAL_TABLET | Freq: Three times a day (TID) | ORAL | 2 refills | Status: AC

## 2020-08-09 NOTE — Progress Notes (Signed)
Date:  08/09/2020   Name:  Jacob Allen   DOB:  03-Jan-1956   MRN:  [id number]   Chief Complaint: Annual Exam  Patient is a 65 year old male who presents for a comprehensive physical exam. The patient reports the following problems: joint pain. Health maintenance has been reviewed ? Colonoscopy.     Lab Results  Component Value Date   CREATININE 0.83 09/11/2017   BUN 14 09/11/2017   NA 140 09/11/2017   K 4.8 09/11/2017   CL 99 09/11/2017   CO2 23 09/11/2017   Lab Results  Component Value Date   CHOL 271 (H) 09/11/2017   HDL 64 09/11/2017   LDLCALC 192 (H) 09/11/2017   TRIG 76 09/11/2017   CHOLHDL 4.2 09/11/2017   No results found for: TSH No results found for: HGBA1C No results found for: WBC, HGB, HCT, MCV, PLT No results found for: ALT, AST, GGT, ALKPHOS, BILITOT   Review of Systems  Constitutional:  Negative for chills and fever.  HENT:  Negative for drooling, ear discharge, ear pain and sore throat.   Respiratory:  Negative for cough, shortness of breath and wheezing.   Cardiovascular:  Negative for chest pain, palpitations and leg swelling.  Gastrointestinal:  Negative for abdominal pain, blood in stool, constipation, diarrhea and nausea.  Endocrine: Negative for polydipsia.  Genitourinary:  Negative for dysuria, frequency, hematuria and urgency.  Musculoskeletal:  Negative for back pain, myalgias and neck pain.  Skin:  Negative for rash.  Allergic/Immunologic: Negative for environmental allergies.  Neurological:  Negative for dizziness and headaches.  Hematological:  Does not bruise/bleed easily.  Psychiatric/Behavioral:  Negative for suicidal ideas. The patient is not nervous/anxious.    Patient Active Problem List   Diagnosis Date Noted   Cervical radiculitis 07/13/2015   Cervical pain (neck) 07/13/2015   Annual physical exam 09/08/2014   BPH with obstruction/lower urinary tract symptoms 08/15/2014    No Known Allergies  Past Surgical History:   Procedure Laterality Date   COLONOSCOPY  03/2013   cleared for 10 yrs- Dr Allen Norris   SHOULDER SURGERY Right 1990    Social History   Tobacco Use   Smoking status: Former   Smokeless tobacco: Never   Tobacco comments:    quit 2 years ago  Substance Use Topics   Alcohol use: Yes    Alcohol/week: 0.0 standard drinks    Comment: every day   Drug use: No     Medication list has been reviewed and updated.  Current Meds  Medication Sig   ibuprofen (ADVIL,MOTRIN) 800 MG tablet TAKE (1) TABLET BY MOUTH THREE TIMES A DAY   sildenafil (REVATIO) 20 MG tablet Take 20 mg by mouth 3 (three) times daily.    PHQ 2/9 Scores 08/09/2020 09/11/2016  PHQ - 2 Score 1 0  PHQ- 9 Score 5 1    GAD 7 : Generalized Anxiety Score 08/09/2020  Nervous, Anxious, on Edge 0  Control/stop worrying 0  Worry too much - different things 0  Trouble relaxing 0  Restless 0  Easily annoyed or irritable 1  Afraid - awful might happen 0  Total GAD 7 Score 1  Anxiety Difficulty Somewhat difficult    BP Readings from Last 3 Encounters:  08/09/20 120/70  04/01/18 134/89  09/11/17 132/84    Physical Exam Vitals and nursing note reviewed.  Constitutional:      Appearance: Normal appearance. He is well-groomed and normal weight.  HENT:  Head: Normocephalic.     Jaw: There is normal jaw occlusion.     Right Ear: Hearing, tympanic membrane, ear canal and external ear normal.     Left Ear: Hearing, tympanic membrane, ear canal and external ear normal.     Nose: Nose normal. No nasal deformity or septal deviation.     Mouth/Throat:     Lips: Pink.     Mouth: Mucous membranes are moist. Mucous membranes are pale.     Dentition: Normal dentition. No dental tenderness.     Tongue: No lesions. Tongue does not deviate from midline.     Palate: No mass and lesions.     Pharynx: Oropharynx is clear. Uvula midline. No pharyngeal swelling or oropharyngeal exudate.  Eyes:     General: Lids are normal. Vision  grossly intact. Gaze aligned appropriately. No visual field deficit or scleral icterus.       Right eye: No discharge.        Left eye: No discharge.     Extraocular Movements: Extraocular movements intact.     Right eye: Normal extraocular motion.     Left eye: Normal extraocular motion.     Conjunctiva/sclera: Conjunctivae normal.     Pupils: Pupils are equal, round, and reactive to light.     Funduscopic exam:    Right eye: Red reflex present.        Left eye: Red reflex present. Neck:     Thyroid: No thyroid mass, thyromegaly or thyroid tenderness.     Vascular: Normal carotid pulses. No carotid bruit, hepatojugular reflux or JVD.     Trachea: Trachea and phonation normal. No tracheal deviation.  Cardiovascular:     Rate and Rhythm: Normal rate and regular rhythm.     Chest Wall: PMI is not displaced. No thrill.     Pulses: Normal pulses.          Carotid pulses are 2+ on the right side and 2+ on the left side.      Radial pulses are 2+ on the right side and 2+ on the left side.       Femoral pulses are 2+ on the right side and 2+ on the left side.      Popliteal pulses are 2+ on the right side and 2+ on the left side.       Dorsalis pedis pulses are 2+ on the right side and 2+ on the left side.       Posterior tibial pulses are 2+ on the right side and 2+ on the left side.     Heart sounds: Normal heart sounds, S1 normal and S2 normal. No murmur heard. No systolic murmur is present.  No diastolic murmur is present.    No friction rub. No gallop. No S3 or S4 sounds.  Pulmonary:     Effort: No respiratory distress.     Breath sounds: Normal breath sounds. No decreased breath sounds, wheezing, rhonchi or rales.  Chest:     Chest wall: No mass or tenderness.  Breasts:    Right: Normal. No axillary adenopathy or supraclavicular adenopathy.     Left: Normal. No axillary adenopathy or supraclavicular adenopathy.  Abdominal:     General: Bowel sounds are normal.     Palpations:  Abdomen is soft. There is no hepatomegaly, splenomegaly or mass.     Tenderness: There is no abdominal tenderness. There is no right CVA tenderness, left CVA tenderness, guarding or rebound.     Hernia:  No hernia is present. There is no hernia in the umbilical area, ventral area, left inguinal area or right inguinal area.  Genitourinary:    Penis: Normal.      Testes: Normal.        Right: Mass not present.        Left: Mass not present.     Epididymis:     Right: Normal.     Left: Normal.     Prostate: Normal. Not enlarged, not tender and no nodules present.     Rectum: Normal. Guaiac result negative. No mass.  Musculoskeletal:        General: No tenderness. Normal range of motion.     Cervical back: Normal, full passive range of motion without pain, normal range of motion and neck supple.     Thoracic back: Normal.     Lumbar back: Normal.  Lymphadenopathy:     Head:     Right side of head: No submandibular or tonsillar adenopathy.     Left side of head: No submandibular or tonsillar adenopathy.     Cervical: No cervical adenopathy.     Right cervical: No superficial, deep or posterior cervical adenopathy.    Left cervical: No superficial, deep or posterior cervical adenopathy.     Upper Body:     Right upper body: No supraclavicular, axillary or pectoral adenopathy.     Left upper body: No supraclavicular, axillary or pectoral adenopathy.     Lower Body: No right inguinal adenopathy. No left inguinal adenopathy.  Skin:    General: Skin is warm.     Capillary Refill: Capillary refill takes less than 2 seconds.     Findings: No rash.  Neurological:     Mental Status: He is alert and oriented to person, place, and time.     Cranial Nerves: Cranial nerves are intact. No cranial nerve deficit, dysarthria or facial asymmetry.     Sensory: Sensation is intact.     Motor: Motor function is intact. No weakness or tremor.     Deep Tendon Reflexes: Reflexes are normal and symmetric.      Reflex Scores:      Tricep reflexes are 2+ on the right side and 2+ on the left side.      Bicep reflexes are 2+ on the right side and 2+ on the left side.      Brachioradialis reflexes are 2+ on the right side and 2+ on the left side.      Patellar reflexes are 2+ on the right side and 2+ on the left side.      Achilles reflexes are 2+ on the right side and 2+ on the left side. Psychiatric:        Behavior: Behavior is cooperative.    Wt Readings from Last 3 Encounters:  08/09/20 203 lb (92.1 kg)  04/01/18 200 lb (90.7 kg)  09/11/17 200 lb (90.7 kg)    BP 120/70   Pulse 60   Ht 5\' 10"  (1.778 m)   Wt 203 lb (92.1 kg)   BMI 29.13 kg/m   Assessment and Plan: Jacob Allen is a 65 y.o. male who presents today for his Complete Annual Exam. He feels well. He reports exercising daily with active work. He reports he is sleeping fairly well.  Patient's chart was reviewed for previous encounters most recent labs most recent imaging and care everywhere. 1. Annual physical exam Immunizations are reviewed and recommendations provided.   Age appropriate screening tests  are discussed. Counseling given for risk factor reduction interventions.  No subjective/objective concerns other than noted below.  We will obtain a CMP, lipid panel, CBC, and PSA. - Comprehensive metabolic panel - Lipid Panel With LDL/HDL Ratio - CBC with Differential/Platelet - PSA  2. BPH with obstruction/lower urinary tract symptoms Patient previously was treated for BPH symptoms with Cardura which was stopped due to intolerance and finasteride which was self stopped due to concerns about erectile dysfunction.  Patient is tolerating current circumstance without significant hesitancy and diminished stream.  Will check PSA for the following current level and proceed at current regimen. - PSA  3. Vasculogenic erectile dysfunction, unspecified vasculogenic erectile dysfunction type Chronic.  Episodic.  Stable.  Uncomplicated.   Patient would like to be on sildenafil and not Cialis.  Patient has been given a prescription for 20 mg tadalafil to be taken 3 tablets as needed as needed. - sildenafil (REVATIO) 20 MG tablet; Take 1 tablet (20 mg total) by mouth 3 (three) times daily.  Dispense: 60 tablet; Refill: 2  4. Colon cancer screening Discussed and referral is pending if we find out information that Dr. Allen Norris has done previous colonoscopy.

## 2020-08-09 NOTE — Patient Instructions (Signed)
GUIDELINES FOR  °LOW-CHOLESTEROL, LOW-TRIGLYCERIDE DIETS  °  °FOODS TO USE  ° °MEATS, FISH Choose lean meats (chicken, turkey, veal, and non-fatty cuts of beef with excess fat trimmed; one serving = 3 oz of cooked meat). Also, fresh or frozen fish, canned fish packed in water, and shellfish (lobster, crabs, shrimp, and oysters). Limit use to no more than one serving of one of these per week. Shellfish are high in cholesterol but low in saturated fat and should be used sparingly. Meats and fish should be broiled (pan or oven) or baked on a rack.  °EGGS Egg substitutes and egg whites (use freely). Egg yolks (limit two per week).  °FRUITS Eat three servings of fresh fruit per day (1 serving = ½ cup). Be sure to have at least one citrus fruit daily. Frozen and canned fruit with no sugar or syrup added may be used.  °VEGETABLES Most vegetables are not limited (see next page). One dark-green (string beans, escarole) or one deep yellow (squash) vegetable is recommended daily. Cauliflower, broccoli, and celery, as well as potato skins, are recommended for their fiber content. (Fiber is associated with cholesterol reduction) It is preferable to steam vegetables, but they may be boiled, strained, or braised with polyunsaturated vegetable oil (see below).  °BEANS Dried peas or beans (1 serving = ½ cup) may be used as a bread substitute.  °NUTS Almonds, walnuts, and peanuts may be used sparingly  °(1 serving = 1 Tablespoonful). Use pumpkin, sesame, or sunflower seeds.  °BREADS, GRAINS One roll or one slice of whole grain or enriched bread may be used, or three soda crackers or four pieces of melba toast as a substitute. Spaghetti, [place] or noodles (½ cup) or ½ large ear of corn may be used as a bread substitute. In preparing these foods do not use butter or shortening, use soft margarine. Also use egg and sugar substitutes.  Choose high fiber grains, such as oats and whole wheat.  °CEREALS Use ½ cup of hot cereal or ¾ cup of  cold cereal per day. Add a sugar substitute if desired, with 99% fat free or skim milk.  °MILK PRODUCTS Always use 99% fat free or skim milk, dairy products such as low fat cheeses (farmer's uncreamed diet cottage), low-fat yogurt, and powdered skim milk.  °FATS, OILS Use soft (not stick) margarine; vegetable oils that are high in polyunsaturated fats (such as safflower, sunflower, soybean, corn, and cottonseed). Always refrigerate meat drippings to harden the fat and remove it before preparing gravies  °DESSERTS, SNACKS Limit to two servings per day; substitute each serving for a bread/cereal serving: ice milk, water sherbet (1/4 cup); unflavored gelatin or gelatin flavored with sugar substitute (1/3 cup); pudding prepared with skim milk (1/2 cup); egg white soufflés; unbuttered popcorn (1 ½ cups). Substitute carob for chocolate.  °BEVERAGES Fresh fruit juices (limit 4 oz per day); black coffee, plain or herbal teas; soft drinks with sugar substitutes; club soda, preferably salt-free; cocoa made with skim milk or nonfat dried milk and water (sugar substitute added if desired); clear broth. Alcohol: limit two servings per day (see second page).  °MISCELLANEOUS ° You may use the following freely: vinegar, spices, herbs, nonfat bouillon, mustard, Worcestershire sauce, soy sauce, flavoring essence.  ° ° ° ° ° ° ° ° ° ° ° ° ° ° ° ° °GUIDELINES FOR  °LOW-CHOLESTEROL, LOW TRIGLYCERIDE DIETS  °  °FOODS TO AVOID  ° °MEATS, FISH Marbled beef, pork, bacon, sausage, and other pork products; fatty   fowl (duck, goose); skin and fat of turkey and chicken; processed meats; luncheon meats (salami, bologna); frankfurters and fast-food hamburgers (theyre loaded with fat); organ meats (kidneys, liver); canned fish packed in oil.  °EGGS Limit egg yolks to two per week.   °FRUITS Coconuts (rich in saturated fats).  °VEGETABLES Avoid avocados. Starchy vegetables (potatoes, corn, lima beans, dried peas, beans) may be used only if  substitutes for a serving of bread or cereal. (Baked potato skin, however, is desirable for its fiber content.  °BEANS Commercial baked beans with sugar and/or pork added.  °NUTS Avoid nuts.  Limit peanuts and walnuts to one tablespoonful per day.  °BREADS, GRAINS Any baked goods with shortening and/or sugar. Commercial mixes with dried eggs and whole milk. Avoid sweet rolls, doughnuts, breakfast pastries (Danish), and sweetened packaged cereals (the added sugar converts readily to triglycerides).  °MILK PRODUCTS Whole milk and whole-milk packaged goods; cream; ice cream; whole-milk puddings, yogurt, or cheeses; nondairy cream substitutes.  °FATS, OILS Butter, lard, animal fats, bacon drippings, gravies, cream sauces as well as palm and coconut oils. All these are high in saturated fats. Examine labels on cholesterol free products for hydrogenated fats. (These are oils that have been hardened into solids and in the process have become saturated.)  °DESSERTS, SNACKS Fried snack foods like potato chips; chocolate; candies in general; jams, jellies, syrups; whole- milk puddings; ice cream and milk sherbets; hydrogenated peanut butter.  °BEVERAGES Sugared fruit juices and soft drinks; cocoa made with whole milk and/or sugar. When using alcohol (1 oz liquor, 5 oz beer, or 2 ½ oz dry table wine per serving), one serving must be substituted for one bread or cereal serving (limit, two servings of alcohol per day).  ° SPECIAL NOTES  °  Remember that even non-limited foods should be used in moderation. °While on a cholesterol-lowering diet, be sure to avoid animal fats and marbled meats. °3. While on a triglyceride-lowering diet, be sure to avoid sweets and to control the amount of carbohydrates you eat (starchy foods such as flour, bread, potatoes).While on a tri-glyceride-lowering diet, be sure to avoid sweets °Buy a good low-fat cookbook, such as the one published by the American Heart Association. °Consult your physician  if you have any questions.  ° ° ° ° ° ° ° ° ° ° ° ° ° °[facility] Low Glycemic Diet Plan ° ° °Low Glycemic Foods (20-49) Moderate Glycemic Foods (50-69) High Glycemic Foods (70-100)  °    °Breakfast Creals Breakfast Cereals Breakfast Cereals  °All Bran All-Bran Fruit'n Oats  ° Bran Buds Bran Chex  ° Cheerios Corn chex  °  °Fiber One Oatmeal (not instant)  ° Just Right Mini-Wheats  ° Corn Flakes Cream of Wheat  °  °Oat Bran Special K Swiss Muesli  ° Grape Nuts Grape Nut Flakes  °  °  Grits Nutri-Grain  °  °Fruits and fruit juice: Fruits Puffed [place] Puffed Wheat  °  °(Limit to 1-2 Servings per day) Banana (under-ride) Dates  ° [place]on Chex [place] Krispies  °  °Apples Apricots (fresh/dried)  ° Figs Grapes  ° Shredded Wheat Team  °  °Blackberries Blueberries  ° Kiwi Mango  ° Total   °  °Cherries Cranberries  ° Oranges Raisins  °   °Peaches Pears  °  Fruits  °Plums Prunes  ° Fruit Juices Pineapple Watermelon  °  °Grapefruit Raspberries  ° Cranberry Juice Orange Juice  ° Banana (over-ripe)   °  °Strawberries Tangerines  °    °  Apple Juice Grapefruit Juice  ° Beans and Legumes Beverages  °Tomato Juice   ° [place]-type baked beans Sodas, sweet tea, pineapple juice  ° Canned pinto, kidney, or navy beans   °Beans and Legumes (fresh-cooked) Green peas Vegetables  °Black-eyed peas Butter Beans  °  Potato, baked, boiled, fried, mashed  °Chick peas Lentils  ° Vegetables French fries  °Green beans Lima beans  ° Beets Carrots  ° Canned or frozen corn  °Kidney beans Navy beans  ° Sweet potato Yam  ° Parsnips  °Pinto beans Snow peas  ° Corn on the cob Winter squash  °    °Non-starchy vegetables Grains Breads  °Asparagus, avocado, broccoli, cabbage Cornmeal [place], brown  ° Most breads (white and whole grain)  °cauliflower, celery, cucumber, greens Corrow, white Couscous  ° Bagels Bread sticks  °  °lettuce, mushrooms, peppers, tomatoes  Bread stuffing Kaiser roll  °  °okra, onions, spinach, summer squash Pasta Dinner rolls  ° Macaroni  Pizza, cheese  °   °Grains Ravioli, meat filled Spaghetti, white  ° Grains  °Barley Bulgur  °  Hsu, instant Tapioca, with milk  °  °Rye Wild Bednarski  ° Nuts   ° Cashews Macadamia  ° Candy and most cookies  °Nuts and oils    °Almonds, peanuts, sunflower seeds Snacks Snacks  °hazelnuts, pecans, walnuts Chocolate Ice cream, lowfat  ° Donuts Corn chips  °  °Oils that are liquid at room temperature Muffin Popcorn  ° Jelly beans Pretzels  °  °  Pastries  °Dairy, fish, meat, soy, and eggs    °Milk, skim Lowfat cheese  °  Restaurant and ethnic foods  °Yogurt, lowfat, fruit sugar sweetened  Most Chinese food (sugar in stir fry  °  or wok sauce)  °Lean red meat Fish  °  Teriyaki-style meats and vegetables  °Skinless chicken and turkey, shellfish    °    °Egg whites (up to 3 daily), Soy Products    °Egg yolks (up to 7 or _____ per week)    °  °

## 2020-08-10 LAB — COMPREHENSIVE METABOLIC PANEL
ALT: 77 IU/L — ABNORMAL HIGH (ref 0–44)
AST: 50 IU/L — ABNORMAL HIGH (ref 0–40)
Albumin/Globulin Ratio: 1.9 (ref 1.2–2.2)
Albumin: 4.7 g/dL (ref 3.8–4.8)
Alkaline Phosphatase: 55 IU/L (ref 44–121)
BUN/Creatinine Ratio: 16 (ref 10–24)
BUN: 14 mg/dL (ref 8–27)
Bilirubin Total: 0.5 mg/dL (ref 0.0–1.2)
CO2: 21 mmol/L (ref 20–29)
Calcium: 9.9 mg/dL (ref 8.6–10.2)
Chloride: 99 mmol/L (ref 96–106)
Creatinine, Ser: 0.87 mg/dL (ref 0.76–1.27)
Globulin, Total: 2.5 g/dL (ref 1.5–4.5)
Glucose: 98 mg/dL (ref 65–99)
Potassium: 5.3 mmol/L — ABNORMAL HIGH (ref 3.5–5.2)
Sodium: 137 mmol/L (ref 134–144)
Total Protein: 7.2 g/dL (ref 6.0–8.5)
eGFR: 98 mL/min/{1.73_m2} (ref >59)

## 2020-08-10 LAB — CBC WITH DIFFERENTIAL/PLATELET
Basophils Absolute: 0 10*3/uL (ref 0.0–0.2)
Basos: 1 %
EOS (ABSOLUTE): 0.1 10*3/uL (ref 0.0–0.4)
Eos: 2 %
Hematocrit: 46.6 % (ref 37.5–51.0)
Hemoglobin: 15.5 g/dL (ref 13.0–17.7)
Immature Grans (Abs): 0 10*3/uL (ref 0.0–0.1)
Immature Granulocytes: 0 %
Lymphocytes Absolute: 2.3 10*3/uL (ref 0.7–3.1)
Lymphs: 36 %
MCH: 31.8 pg (ref 26.6–33.0)
MCHC: 33.3 g/dL (ref 31.5–35.7)
MCV: 96 fL (ref 79–97)
Monocytes Absolute: 0.7 10*3/uL (ref 0.1–0.9)
Monocytes: 12 %
Neutrophils Absolute: 3.2 10*3/uL (ref 1.4–7.0)
Neutrophils: 49 %
Platelets: 234 10*3/uL (ref 150–450)
RBC: 4.88 x10E6/uL (ref 4.14–5.80)
RDW: 12.7 % (ref 11.6–15.4)
WBC: 6.4 10*3/uL (ref 3.4–10.8)

## 2020-08-10 LAB — LIPID PANEL WITH LDL/HDL RATIO
Cholesterol, Total: 284 mg/dL — ABNORMAL HIGH (ref 100–199)
HDL: 65 mg/dL (ref >39)
LDL Chol Calc (NIH): 204 mg/dL — ABNORMAL HIGH (ref 0–99)
LDL/HDL Ratio: 3.1 ratio (ref 0.0–3.6)
Triglycerides: 90 mg/dL (ref 0–149)
VLDL Cholesterol Cal: 15 mg/dL (ref 5–40)

## 2020-08-10 LAB — PSA: Prostate Specific Ag, Serum: 1.1 ng/mL (ref 0.0–4.0)

## 2020-08-23 ENCOUNTER — Telehealth: Payer: Self-pay

## 2020-08-23 NOTE — Telephone Encounter (Signed)
Labs printed and mailed to pt. Spoke to pt is he going to stop back by the office to sign his release of information form.   KP

## 2020-08-23 NOTE — Telephone Encounter (Signed)
Copied from [place] [phone]. Topic: General - Other >> Aug 23, 2020  1:23 PM Leward Quan A wrote: Reason for CRM: Patient called asking for Jacob Allen would like his lab results mailed to  ( [place] [id number] )  Also stated he dropped by the office at 1.10 PM but door was locked he was not able to sign paper work. Can be reached at Ph# [phone]

## 2020-09-27 NOTE — Progress Notes (Signed)
09/28/2020  8:38 AM   Jacob Allen 04-18-1955 [id number]  Referring provider: Juline Patch, MD [address] [place],  [place] [address]  Urological history: 1. BPH with LU TS -PSA 1.1 in 07/2020 -I PSS 14/3  2. ED -contributing factors of age, former smoker and BPH -SHIM 33 -managed with sildenafil 20 mg, on-demand-dosing   Chief Complaint  Patient presents with   Erectile Dysfunction     HPI: Jacob Allen is a 65 y.o. male who presents today for follow up.  He has been off his BPH medication since 2020 and he has noticed a worsening of urinary hesitancy, slowing of the urinary stream and a weak urinary stream.  Patient denies any modifying or aggravating factors.  Patient denies any gross hematuria, dysuria or suprapubic/flank pain.  Patient denies any fevers, chills, nausea or vomiting.      IPSS     Row Name 09/28/20 0800         International Prostate Symptom Score   How often have you had the sensation of not emptying your bladder? About half the time     How often have you had to urinate less than every two hours? Less than half the time     How often have you found you stopped and started again several times when you urinated? Less than half the time     How often have you found it difficult to postpone urination? About half the time     How often have you had a weak urinary stream? Less than half the time     How often have you had to strain to start urination? Less than 1 in 5 times     How many times did you typically get up at night to urinate? 1 Time     Total IPSS Score 14           Quality of Life due to urinary symptoms   If you were to spend the rest of your life with your urinary condition just the way it is now how would you feel about that? Mixed               Score:  1-7 Mild 8-19 Moderate 20-35 Severe  He states that the reason he stopped the BPH medication is it was interfering with erections and the PDE 5 inhibitors  while effective he did not care for the side effects.   [place] Name 09/28/20 [phone]         SHIM: Over the last 6 months:   How do you rate your confidence that you could get and keep an erection? Low     When you had erections with sexual stimulation, how often were your erections hard enough for penetration (entering your partner)? Sometimes (about half the time)     During sexual intercourse, how often were you able to maintain your erection after you had penetrated (entered) your partner? Sometimes (about half the time)     During sexual intercourse, how difficult was it to maintain your erection to completion of intercourse? Difficult     When you attempted sexual intercourse, how often was it satisfactory for you? Sometimes (about half the time)           SHIM Total Score   SHIM 14               Score: 1-7 Severe ED 8-11 Moderate ED 12-16 Mild-Moderate  ED 17-21 Mild ED 22-25 No ED   PMH: Past Medical History:  Diagnosis Date   Arthritis    Benign neoplasm of prostate    Benign prostate hyperplasia    BPH (benign prostatic hyperplasia)    Bronchitis    Lumbosacral ligament sprain    Nocturia    Osteoarthritis    lumbar spine    Surgical History: Past Surgical History:  Procedure Laterality Date   COLONOSCOPY  03/2013   cleared for 10 yrs- Dr Allen Norris   SHOULDER SURGERY Right 1990    Home Medications:  Allergies as of 09/28/2020   No Known Allergies      Medication List        Accurate as of September 28, 2020  8:38 AM. If you have any questions, ask your nurse or doctor.          doxazosin 4 MG tablet Commonly known as: CARDURA Take 1 tablet (4 mg total) by mouth daily. Started by: Zara Council, PA-C   finasteride 5 MG tablet Commonly known as: Proscar Take 1 tablet (5 mg total) by mouth daily. Started by: Zara Council, PA-C   ibuprofen 800 MG tablet Commonly known as: ADVIL TAKE (1) TABLET BY MOUTH THREE TIMES A DAY    sildenafil 20 MG tablet Commonly known as: REVATIO Take 1 tablet (20 mg total) by mouth 3 (three) times daily.        Allergies: No Known Allergies  Family History: Family History  Problem Relation Age of Onset   Kidney disease Neg Hx    Prostate cancer Neg Hx     Social History:  reports that he has quit smoking. He has never used smokeless tobacco. He reports current alcohol use. He reports that he does not use drugs.  ROS: For pertinent review of systems please refer to history of present illness   Physical Exam: BP 138/70   Pulse 69   Ht 5' 10" (1.778 m)   Wt 200 lb (90.7 kg)   BMI 28.70 kg/m   Constitutional:  Well nourished. Alert and oriented, No acute distress. HEENT: [place] AT, mask in place.  Trachea midline Cardiovascular: No clubbing, cyanosis, or edema. Respiratory: Normal respiratory effort, no increased work of breathing. GU: No CVA tenderness.  No bladder fullness or masses.  Patient with circumcised phallus.   Urethral meatus is patent.  No penile discharge. No penile lesions or rashes. Scrotum without lesions, cysts, rashes and/or edema.  Testicles are located scrotally bilaterally. No masses are appreciated in the testicles. Left and right epididymis are normal. Rectal: Patient with  normal sphincter tone. Anus and perineum without scarring or rashes. No rectal masses are appreciated. Prostate is approximately 50 + grams, could only apex and midportion of gland, no nodules are appreciated. Seminal vesicles could not be palpated Neurologic: Grossly intact, no focal deficits, moving all 4 extremities. Psychiatric: Normal mood and affect.   Laboratory Data: Component     Latest Ref Rng & Units 03/26/2016 04/01/2017 03/30/2018 08/09/2020  Prostate Specific Ag, Serum     0.0 - 4.0 ng/mL 0.5 0.4 0.3 1.1   Component     Latest Ref Rng & Units 08/09/2020  WBC     3.4 - 10.8 x10E3/uL 6.4  RBC     4.14 - 5.80 x10E6/uL 4.88  Hemoglobin     13.0 - 17.7 g/dL 15.5   HCT     37.5 - 51.0 % 46.6  MCV     79 - 97 fL  96  MCH     26.6 - 33.0 pg 31.8  MCHC     31.5 - 35.7 g/dL 33.3  RDW     11.6 - 15.4 % 12.7  Platelets     150 - 450 x10E3/uL 234  Neutrophils     Not Estab. % 49  Lymphs     Not Estab. % 36  Monocytes     Not Estab. % 12  Eos     Not Estab. % 2  Basos     Not Estab. % 1  NEUT#     1.4 - 7.0 x10E3/uL 3.2  Lymphocyte #     0.7 - 3.1 x10E3/uL 2.3  Monocytes Absolute     0.1 - 0.9 x10E3/uL 0.7  EOS (ABSOLUTE)     0.0 - 0.4 x10E3/uL 0.1  Basophils Absolute     0.0 - 0.2 x10E3/uL 0.0  Immature Granulocytes     Not Estab. % 0  Immature Grans (Abs)     0.0 - 0.1 x10E3/uL 0.0   Component     Latest Ref Rng & Units 08/09/2020  Cholesterol, Total     100 - 199 mg/dL 284 (H)  Triglycerides     0 - 149 mg/dL 90  HDL Cholesterol     >39 mg/dL 65  VLDL Cholesterol Cal     5 - 40 mg/dL 15  LDL Chol Calc (NIH)     0 - 99 mg/dL 204 (H)  LDL/HDL Ratio     0.0 - 3.6 ratio 3.1   Component     Latest Ref Rng & Units 08/09/2020  Glucose     65 - 99 mg/dL 98  BUN     8 - 27 mg/dL 14  Creatinine     0.76 - 1.27 mg/dL 0.87  eGFR     >59 mL/min/1.73 98  BUN/Creatinine Ratio     10 - 24 16  Sodium     134 - 144 mmol/L 137  Potassium     3.5 - 5.2 mmol/L 5.3 (H)  Chloride     96 - 106 mmol/L 99  CO2     20 - 29 mmol/L 21  Calcium     8.6 - 10.2 mg/dL 9.9  Total Protein     6.0 - 8.5 g/dL 7.2  Albumin     3.8 - 4.8 g/dL 4.7  Globulin, Total     1.5 - 4.5 g/dL 2.5  Albumin/Globulin Ratio     1.2 - 2.2 1.9  Total Bilirubin     0.0 - 1.2 mg/dL 0.5  Alkaline Phosphatase     44 - 121 IU/L 55  AST     0 - 40 IU/L 50 (H)  ALT     0 - 44 IU/L 77 (H)  I have reviewed the labs.    Assessment & Plan:    1. BPH with LUTS -PSA slight increase - secondary to discontinued finasteride  -DRE benign -discussed bladder outlet procedure vs taking BPH medications as he experiences sexual side effects with the  medication -He would like to try the medication at this time and I have given him more information on his AVS regarding the work-up for a bladder outlet procedure and the bladder outlet procedure some cells -restart finasteride 5 mg daily and Cardura 4 mg daily   2. Erectile dysfunction -worsened by BPH med's -Has a prescription for sildenafil from his PCP  Return in about 1 year (around 09/28/2021) for  IPSS, SHIM, PSA and exam.  Zara Council, [facility]  [place] [address] [place], [place] [address] [phone]

## 2020-09-28 ENCOUNTER — Other Ambulatory Visit: Payer: Self-pay

## 2020-09-28 ENCOUNTER — Encounter: Payer: Self-pay | Admitting: Urology

## 2020-09-28 ENCOUNTER — Ambulatory Visit (INDEPENDENT_AMBULATORY_CARE_PROVIDER_SITE_OTHER): Payer: 59 | Admitting: Urology

## 2020-09-28 VITALS — BP 138/70 | HR 69 | Ht 70.0 in | Wt 200.0 lb

## 2020-09-28 DIAGNOSIS — N401 Enlarged prostate with lower urinary tract symptoms: Secondary | ICD-10-CM | POA: Diagnosis not present

## 2020-09-28 DIAGNOSIS — N529 Male erectile dysfunction, unspecified: Secondary | ICD-10-CM

## 2020-09-28 DIAGNOSIS — N138 Other obstructive and reflux uropathy: Secondary | ICD-10-CM | POA: Diagnosis not present

## 2020-09-28 MED ORDER — DOXAZOSIN MESYLATE 4 MG PO TABS: 4.0000 mg | ORAL_TABLET | Freq: Every day | ORAL | 3 refills | Status: DC

## 2020-09-28 MED ORDER — FINASTERIDE 5 MG PO TABS: 5.0000 mg | ORAL_TABLET | Freq: Every day | ORAL | 3 refills | Status: DC

## 2021-02-05 ENCOUNTER — Ambulatory Visit: Payer: Self-pay | Admitting: Urology

## 2021-08-12 ENCOUNTER — Encounter: Payer: Self-pay | Admitting: Family Medicine

## 2021-08-30 ENCOUNTER — Telehealth: Payer: Self-pay

## 2021-08-30 NOTE — Telephone Encounter (Signed)
Pt called saying he is running out of finasteride and doxazosin. [place] he is unable to get an appointment with a doctor for atleast 3-6 months. He is on a wait list to get in sooner. he is asking if we can send refills in for him. [facility]

## 2021-09-18 ENCOUNTER — Other Ambulatory Visit: Payer: Self-pay | Admitting: Urology

## 2021-09-18 ENCOUNTER — Telehealth: Payer: Self-pay | Admitting: Urology

## 2021-09-18 DIAGNOSIS — N138 Other obstructive and reflux uropathy: Secondary | ICD-10-CM

## 2021-09-18 MED ORDER — DOXAZOSIN MESYLATE 4 MG PO TABS: 4.0000 mg | ORAL_TABLET | Freq: Every day | ORAL | 0 refills | Status: AC

## 2021-09-18 MED ORDER — FINASTERIDE 5 MG PO TABS: 5.0000 mg | ORAL_TABLET | Freq: Every day | ORAL | 0 refills | Status: AC

## 2021-09-18 NOTE — Telephone Encounter (Signed)
Patient called and refills have not been sent in.  Nori Riis, PA-C to Alvera Novel, [facility]      09/08/21 10:38 PM We can give him a 30 day supply of each.  August 30, 2021       08/30/21  8:20 AM Alvera Novel, CMA routed this conversation to Dedra Skeens, [facility]      08/30/21  8:20 AM Note   Pt called saying he is running out of finasteride and doxazosin. [place] he is unable to get an appointment with a doctor for atleast 3-6 months. He is on a wait list to get in sooner. he is asking if we can send refills in for him. [facility].      Additional [place] [address] in [place] # [phone]

## 2021-10-01 ENCOUNTER — Ambulatory Visit: Payer: 59 | Admitting: Urology
